# Patient Record
Sex: Male | Born: 1950 | Race: White | Hispanic: No | Marital: Single | Smoking: Current every day smoker
Health system: Southern US, Community
[De-identification: ages and names within clinical notes are randomized; demographics above are authoritative.]

## PROBLEM LIST (undated history)

## (undated) DIAGNOSIS — E119 Type 2 diabetes mellitus without complications: Secondary | ICD-10-CM

## (undated) DIAGNOSIS — I10 Essential (primary) hypertension: Secondary | ICD-10-CM

## (undated) DIAGNOSIS — J449 Chronic obstructive pulmonary disease, unspecified: Secondary | ICD-10-CM

## (undated) DIAGNOSIS — I219 Acute myocardial infarction, unspecified: Secondary | ICD-10-CM

## (undated) HISTORY — PX: CAROTID ENDARTERECTOMY: SUR193

## (undated) HISTORY — PX: HERNIA REPAIR: SHX51

## (undated) HISTORY — PX: CARDIAC SURGERY: SHX584

---

## 2015-07-06 ENCOUNTER — Emergency Department: Payer: Medicare Other

## 2015-07-06 ENCOUNTER — Encounter: Payer: Self-pay | Admitting: Emergency Medicine

## 2015-07-06 ENCOUNTER — Observation Stay
Admission: EM | Admit: 2015-07-06 | Discharge: 2015-07-08 | Disposition: A | Discharge status: Skilled Nursing Facility | Payer: Medicare Other | Attending: Internal Medicine | Admitting: Internal Medicine

## 2015-07-06 DIAGNOSIS — E1140 Type 2 diabetes mellitus with diabetic neuropathy, unspecified: Secondary | ICD-10-CM | POA: Insufficient documentation

## 2015-07-06 DIAGNOSIS — Z7902 Long term (current) use of antithrombotics/antiplatelets: Secondary | ICD-10-CM | POA: Insufficient documentation

## 2015-07-06 DIAGNOSIS — J9811 Atelectasis: Secondary | ICD-10-CM | POA: Diagnosis not present

## 2015-07-06 DIAGNOSIS — J449 Chronic obstructive pulmonary disease, unspecified: Secondary | ICD-10-CM | POA: Insufficient documentation

## 2015-07-06 DIAGNOSIS — Z79891 Long term (current) use of opiate analgesic: Secondary | ICD-10-CM | POA: Diagnosis not present

## 2015-07-06 DIAGNOSIS — Z7982 Long term (current) use of aspirin: Secondary | ICD-10-CM | POA: Diagnosis not present

## 2015-07-06 DIAGNOSIS — R0902 Hypoxemia: Secondary | ICD-10-CM | POA: Insufficient documentation

## 2015-07-06 DIAGNOSIS — Z87891 Personal history of nicotine dependence: Secondary | ICD-10-CM | POA: Diagnosis not present

## 2015-07-06 DIAGNOSIS — R918 Other nonspecific abnormal finding of lung field: Secondary | ICD-10-CM | POA: Insufficient documentation

## 2015-07-06 DIAGNOSIS — R531 Weakness: Secondary | ICD-10-CM | POA: Insufficient documentation

## 2015-07-06 DIAGNOSIS — Z7951 Long term (current) use of inhaled steroids: Secondary | ICD-10-CM | POA: Insufficient documentation

## 2015-07-06 DIAGNOSIS — Z794 Long term (current) use of insulin: Secondary | ICD-10-CM | POA: Insufficient documentation

## 2015-07-06 DIAGNOSIS — I252 Old myocardial infarction: Secondary | ICD-10-CM | POA: Diagnosis not present

## 2015-07-06 DIAGNOSIS — Z951 Presence of aortocoronary bypass graft: Secondary | ICD-10-CM | POA: Insufficient documentation

## 2015-07-06 DIAGNOSIS — I10 Essential (primary) hypertension: Secondary | ICD-10-CM | POA: Insufficient documentation

## 2015-07-06 DIAGNOSIS — R079 Chest pain, unspecified: Secondary | ICD-10-CM | POA: Diagnosis not present

## 2015-07-06 DIAGNOSIS — F319 Bipolar disorder, unspecified: Secondary | ICD-10-CM | POA: Insufficient documentation

## 2015-07-06 DIAGNOSIS — J90 Pleural effusion, not elsewhere classified: Secondary | ICD-10-CM | POA: Insufficient documentation

## 2015-07-06 DIAGNOSIS — R197 Diarrhea, unspecified: Secondary | ICD-10-CM | POA: Diagnosis not present

## 2015-07-06 DIAGNOSIS — Z79899 Other long term (current) drug therapy: Secondary | ICD-10-CM | POA: Insufficient documentation

## 2015-07-06 DIAGNOSIS — Z8249 Family history of ischemic heart disease and other diseases of the circulatory system: Secondary | ICD-10-CM | POA: Insufficient documentation

## 2015-07-06 DIAGNOSIS — R071 Chest pain on breathing: Secondary | ICD-10-CM

## 2015-07-06 HISTORY — DX: Acute myocardial infarction, unspecified: I21.9

## 2015-07-06 HISTORY — DX: Essential (primary) hypertension: I10

## 2015-07-06 HISTORY — DX: Type 2 diabetes mellitus without complications: E11.9

## 2015-07-06 HISTORY — DX: Chronic obstructive pulmonary disease, unspecified: J44.9

## 2015-07-06 LAB — COMPREHENSIVE METABOLIC PANEL
ALBUMIN: 3.2 g/dL — AB (ref 3.5–5.0)
ALK PHOS: 67 U/L (ref 38–126)
ALT: 13 U/L — ABNORMAL LOW (ref 17–63)
AST: 22 U/L (ref 15–41)
Anion gap: 6 (ref 5–15)
BUN: 12 mg/dL (ref 6–20)
CALCIUM: 8.8 mg/dL — AB (ref 8.9–10.3)
CO2: 28 mmol/L (ref 22–32)
Chloride: 103 mmol/L (ref 101–111)
Creatinine, Ser: 0.65 mg/dL (ref 0.61–1.24)
Glucose, Bld: 298 mg/dL — ABNORMAL HIGH (ref 65–99)
Potassium: 4.4 mmol/L (ref 3.5–5.1)
SODIUM: 137 mmol/L (ref 135–145)
Total Bilirubin: 0.3 mg/dL (ref 0.3–1.2)
Total Protein: 6.5 g/dL (ref 6.5–8.1)

## 2015-07-06 LAB — TROPONIN I
Troponin I: <0.03 ng/mL (ref <0.031)
Troponin I: <0.03 ng/mL (ref <0.031)

## 2015-07-06 LAB — GLUCOSE, CAPILLARY: GLUCOSE-CAPILLARY: 153 mg/dL — AB (ref 65–99)

## 2015-07-06 LAB — CBC
HCT: 34.1 % — ABNORMAL LOW (ref 40.0–52.0)
Hemoglobin: 11.2 g/dL — ABNORMAL LOW (ref 13.0–18.0)
MCH: 26.3 pg (ref 26.0–34.0)
MCHC: 32.8 g/dL (ref 32.0–36.0)
MCV: 80.1 fL (ref 80.0–100.0)
Platelets: 230 10*3/uL (ref 150–440)
RBC: 4.26 MIL/uL — AB (ref 4.40–5.90)
RDW: 16 % — AB (ref 11.5–14.5)
WBC: 8.4 10*3/uL (ref 3.8–10.6)

## 2015-07-06 LAB — BRAIN NATRIURETIC PEPTIDE: B Natriuretic Peptide: 122 pg/mL — ABNORMAL HIGH (ref 0.0–100.0)

## 2015-07-06 MED ORDER — BUDESONIDE-FORMOTEROL FUMARATE 160-4.5 MCG/ACT IN AERO
2.0000 | INHALATION_SPRAY | Freq: Every day | RESPIRATORY_TRACT | Status: DC
  Administered 2015-07-07 – 2015-07-08 (×2): 2 via RESPIRATORY_TRACT
  Filled 2015-07-06: qty 6

## 2015-07-06 MED ORDER — PANTOPRAZOLE SODIUM 40 MG PO TBEC
40.0000 mg | DELAYED_RELEASE_TABLET | Freq: Every day | ORAL | Status: DC
  Administered 2015-07-07 – 2015-07-08 (×2): 40 mg via ORAL
  Filled 2015-07-06 (×2): qty 1

## 2015-07-06 MED ORDER — NITROGLYCERIN 2 % TD OINT
0.5000 [in_us] | TOPICAL_OINTMENT | Freq: Once | TRANSDERMAL | Status: AC
  Administered 2015-07-06: 0.5 [in_us] via TOPICAL
  Filled 2015-07-06: qty 1

## 2015-07-06 MED ORDER — INSULIN ASPART 100 UNIT/ML ~~LOC~~ SOLN: 0.0000 [IU] | Freq: Every day | SUBCUTANEOUS | Status: DC

## 2015-07-06 MED ORDER — MIRTAZAPINE 15 MG PO TABS
15.0000 mg | ORAL_TABLET | Freq: Every day | ORAL | Status: DC
Start: 2015-07-06 — End: 2015-07-08
  Administered 2015-07-06 – 2015-07-07 (×2): 15 mg via ORAL
  Filled 2015-07-06 (×2): qty 1

## 2015-07-06 MED ORDER — CLOPIDOGREL BISULFATE 75 MG PO TABS
75.0000 mg | ORAL_TABLET | Freq: Every day | ORAL | Status: DC
  Administered 2015-07-07 – 2015-07-08 (×2): 75 mg via ORAL
  Filled 2015-07-06 (×2): qty 1

## 2015-07-06 MED ORDER — OLANZAPINE 10 MG PO TABS
10.0000 mg | ORAL_TABLET | Freq: Every day | ORAL | Status: DC
  Administered 2015-07-06 – 2015-07-07 (×2): 10 mg via ORAL
  Filled 2015-07-06 (×2): qty 1

## 2015-07-06 MED ORDER — INSULIN GLARGINE 100 UNIT/ML ~~LOC~~ SOLN
20.0000 [IU] | Freq: Every day | SUBCUTANEOUS | Status: DC
  Administered 2015-07-06 – 2015-07-07 (×2): 20 [IU] via SUBCUTANEOUS
  Filled 2015-07-06 (×4): qty 0.2

## 2015-07-06 MED ORDER — ATORVASTATIN CALCIUM 20 MG PO TABS
80.0000 mg | ORAL_TABLET | Freq: Every day | ORAL | Status: DC
  Administered 2015-07-06 – 2015-07-07 (×2): 80 mg via ORAL
  Filled 2015-07-06 (×2): qty 4

## 2015-07-06 MED ORDER — ACETAMINOPHEN 325 MG PO TABS: 650.0000 mg | ORAL_TABLET | Freq: Four times a day (QID) | ORAL | Status: DC | PRN

## 2015-07-06 MED ORDER — SODIUM CHLORIDE 0.9 % IJ SOLN
3.0000 mL | Freq: Two times a day (BID) | INTRAMUSCULAR | Status: DC
  Administered 2015-07-07 – 2015-07-08 (×3): 3 mL via INTRAVENOUS

## 2015-07-06 MED ORDER — TAMSULOSIN HCL 0.4 MG PO CAPS
0.4000 mg | ORAL_CAPSULE | Freq: Every day | ORAL | Status: DC
  Administered 2015-07-07 – 2015-07-08 (×2): 0.4 mg via ORAL
  Filled 2015-07-06 (×2): qty 1

## 2015-07-06 MED ORDER — METOPROLOL TARTRATE 25 MG PO TABS
25.0000 mg | ORAL_TABLET | Freq: Two times a day (BID) | ORAL | Status: DC
  Administered 2015-07-06 – 2015-07-07 (×3): 25 mg via ORAL
  Filled 2015-07-06 (×3): qty 1

## 2015-07-06 MED ORDER — PNEUMOCOCCAL VAC POLYVALENT 25 MCG/0.5ML IJ INJ: 0.5000 mL | INJECTION | INTRAMUSCULAR | Status: DC

## 2015-07-06 MED ORDER — SODIUM CHLORIDE 0.9 % IJ SOLN: 3.0000 mL | INTRAMUSCULAR | Status: DC | PRN

## 2015-07-06 MED ORDER — ENOXAPARIN SODIUM 40 MG/0.4ML ~~LOC~~ SOLN
40.0000 mg | SUBCUTANEOUS | Status: DC
  Administered 2015-07-06 – 2015-07-07 (×2): 40 mg via SUBCUTANEOUS
  Filled 2015-07-06 (×2): qty 0.4

## 2015-07-06 MED ORDER — VALPROIC ACID 250 MG PO CAPS
250.0000 mg | ORAL_CAPSULE | Freq: Two times a day (BID) | ORAL | Status: DC
Start: 2015-07-06 — End: 2015-07-08
  Administered 2015-07-06 – 2015-07-08 (×4): 250 mg via ORAL
  Filled 2015-07-06 (×5): qty 1

## 2015-07-06 MED ORDER — ISOSORBIDE MONONITRATE ER 30 MG PO TB24
30.0000 mg | ORAL_TABLET | Freq: Every day | ORAL | Status: DC
  Administered 2015-07-07: 30 mg via ORAL
  Filled 2015-07-06: qty 1

## 2015-07-06 MED ORDER — SODIUM CHLORIDE 0.9 % IV SOLN: 250.0000 mL | INTRAVENOUS | Status: DC | PRN

## 2015-07-06 MED ORDER — INSULIN ASPART 100 UNIT/ML ~~LOC~~ SOLN
0.0000 [IU] | Freq: Three times a day (TID) | SUBCUTANEOUS | Status: DC
  Administered 2015-07-07: 2 [IU] via SUBCUTANEOUS
  Administered 2015-07-07: 3 [IU] via SUBCUTANEOUS
  Administered 2015-07-08: 1 [IU] via SUBCUTANEOUS
  Filled 2015-07-06: qty 3
  Filled 2015-07-06: qty 2
  Filled 2015-07-06: qty 1

## 2015-07-06 MED ORDER — SODIUM CHLORIDE 0.9 % IJ SOLN
3.0000 mL | Freq: Two times a day (BID) | INTRAMUSCULAR | Status: DC
  Administered 2015-07-06 – 2015-07-08 (×4): 3 mL via INTRAVENOUS

## 2015-07-06 MED ORDER — ASPIRIN EC 81 MG PO TBEC
81.0000 mg | DELAYED_RELEASE_TABLET | Freq: Every day | ORAL | Status: DC
  Administered 2015-07-07 – 2015-07-08 (×2): 81 mg via ORAL
  Filled 2015-07-06 (×2): qty 1

## 2015-07-06 MED ORDER — ACETAMINOPHEN 650 MG RE SUPP: 650.0000 mg | Freq: Four times a day (QID) | RECTAL | Status: DC | PRN

## 2015-07-06 MED ORDER — LINAGLIPTIN 5 MG PO TABS
5.0000 mg | ORAL_TABLET | Freq: Every day | ORAL | Status: DC
  Administered 2015-07-07 – 2015-07-08 (×2): 5 mg via ORAL
  Filled 2015-07-06 (×2): qty 1

## 2015-07-06 NOTE — ED Notes (Signed)
Spoke with MD Letitia Libra. Medications not due at this time.  Pt took his medications this morning. These are were to be sign and held.

## 2015-07-06 NOTE — ED Notes (Signed)
Pt here with daughter. Pt resides in [place]. Reports that about 3 weeks ago, he had cardiac surgery.  Pt here today due to intermittent CP and weakness.  This has been ongoing since the surgery.  Pt also reports episodes of diarrhea but does state that this has been going for a months.  Pt denies nausea.  Pt quit smoking about 2 months ago and has been smoking since it was 64 yrs old.  Pt is alert and oriented, acting appropriately.  Daughter is requesting placement in SNF for cardia rehab.

## 2015-07-06 NOTE — ED Provider Notes (Signed)
Time Seen: Approximately 1330  I have reviewed the triage notes  Chief Complaint: Chest Pain   History of Present Illness: Mario Peterson is a 64 y.o. male who is here with his daughter and recently had surgery performed at [facility] in [place]. He had a quadruple bypass surgery performed which sounds like it was uneventful. Patient was advised according to his daughter to have rehabilitation done at that time but refused. He comes to the emergency department today with multiple complaints including some diffuse weakness, intermittent chest pain with some mild shortness of breath. He said multiple episodes of loose watery stool without any melena or hematochezia. Apparently the loose stools been going on for "" months but got worse after his surgery. The patient denies any persistent nausea, vomiting, fever. His daughter goes on to state that he has been noncompliant with his medications including his psych meds which she is currently trying to establish all of his medications. He states he is also continued to smoke and apparently is not taking his other prescription medication at times. Patient denies being suicidal, homicidal, or having any hallucinations.   Past Medical History  Diagnosis Date  . Diabetes mellitus without complication   . Hypertension   . COPD (chronic obstructive pulmonary disease)   . MI (myocardial infarction)     There are no active problems to display for this patient.   Past Surgical History  Procedure Laterality Date  . Cardiac surgery      Past Surgical History  Procedure Laterality Date  . Cardiac surgery      No current outpatient prescriptions on file.  Allergies:  Review of patient's allergies indicates no known allergies.  Family History: No family history on file.  Social History: Social History  Substance Use Topics  . Smoking status: Former Games developer  . Smokeless tobacco: None  . Alcohol Use: No     Review of Systems:    10 point review of systems was performed and was otherwise negative:  Constitutional: No fever Eyes: No visual disturbances ENT: No sore throat, ear pain Cardiac: No chest pain Respiratory: Describes some mild shortness of breath. Abdomen: No abdominal pain, no vomiting, No diarrhea Endocrine: No weight loss, No night sweats Extremities: No peripheral edema, cyanosis Skin: No rashes, easy bruising Neurologic: He states chronic neuropathy with some tenderness in both lower extremities which is chronic for him No focal weakness, trouble with speech or swollowing Urologic: No dysuria, Hematuria, or urinary frequency Patient answers questions appropriately and does not appear to have any acute psychiatric emergency  Physical Exam:  ED Triage Vitals  Enc Vitals Group     BP 07/06/15 1227 143/57 mmHg     Pulse Rate 07/06/15 1227 85     Resp 07/06/15 1227 20     Temp 07/06/15 1227 98.2 F (36.8 C)     Temp Source 07/06/15 1227 Oral     SpO2 07/06/15 1227 91 %     Weight 07/06/15 1227 172 lb (78.019 kg)     Height 07/06/15 1227 5\' 7"  (1.702 m)     Head Cir --      Peak Flow --      Pain Score 07/06/15 1229 8     Pain Loc --      Pain Edu? --      Excl. in GC? --     General: Awake , Alert , and Oriented times 3; GCS 15 Head: Normal cephalic , atraumatic Eyes: Pupils  equal , round, reactive to light Nose/Throat: No nasal drainage, patent upper airway without erythema or exudate.  Neck: Supple, Full range of motion, No anterior adenopathy or palpable thyroid masses Lungs: Mild rhonchi heard at the left base posteriorly. No wheezes or rales are noted. Heart: Regular rate, regular rhythm without murmurs , gallops , or rubs Abdomen: Soft, non tender without rebound, guarding , or rigidity; bowel sounds positive and symmetric in all 4 quadrants. No organomegaly .        Extremities: Mild tenderness circumferentially in both lower extremities 2 plus symmetric pulses. No edema,  clubbing or cyanosis Neurologic: normal ambulation, Motor symmetric without deficits, sensory intact Skin: warm, dry, no rashes   Labs:   All laboratory work was reviewed including any pertinent negatives or positives listed below:  Labs Reviewed  CBC - Abnormal; Notable for the following:    RBC 4.26 (*)    Hemoglobin 11.2 (*)    HCT 34.1 (*)    RDW 16.0 (*)    All other components within normal limits  COMPREHENSIVE METABOLIC PANEL - Abnormal; Notable for the following:    Glucose, Bld 298 (*)    Calcium 8.8 (*)    Albumin 3.2 (*)    ALT 13 (*)    All other components within normal limits  BRAIN NATRIURETIC PEPTIDE - Abnormal; Notable for the following:    B Natriuretic Peptide 122.0 (*)    All other components within normal limits  C DIFFICILE QUICK SCREEN W PCR REFLEX  TROPONIN I  GI PATHOGEN PANEL BY PCR, STOOL    EKG: ED ECG REPORT I, Jennye Moccasin, the attending physician, personally viewed and interpreted this ECG.  Date: 07/06/2015 EKG Time: 1223 Rate: 86 Rhythm: normal sinus rhythm QRS Axis: normal Intervals: normal ST/T Wave abnormalities: normal Conduction Disutrbances: none Narrative Interpretation: unremarkable Mild old inferior changes.   Radiology: I personally reviewed the radiologic studies   Procedures: Patient had IV established by the nursing staff was placed on continuous cardiac monitor and remained in normal sinus rhythm and no ectopy   Critical Care: None CLINICAL DATA: Chest pain, recent bypass surgery  EXAM: PORTABLE CHEST - 1 VIEW  COMPARISON: None.  FINDINGS: Borderline cardiomegaly. Status post median sternotomy. No pulmonary edema. There is small left pleural effusion with left basilar atelectasis on infiltrate. Trace atelectasis right base.  IMPRESSION: Status post median sternotomy. No pulmonary edema. Small left pleural effusion with left basilar atelectasis or infiltrate. Trace right basilar  atelectasis.   ED Course:  Patient received a low dose nitroglycerin placed. His vital signs remained stable and remained in normal sinus rhythm on the monitor. Patient denies any current chest pain or shortness of breath and his laboratory work reviewed shows no significant abnormalities. Daughters had a hard time taking care of the patient at home due to his noncompliance and a psychiatric history. She is requesting some form of rehabilitation and possible placement. Patient has C. difficile testing is been ordered but given the amount of diarrhea and is otherwise normal clinical assessment I don't strongly suspect this is C. difficile colitis. The patient's chest pain and shortness of breath seems to be intermittent and doesn't seem to be consistent with obvious acute coronary syndrome with negative troponin at this point. I reviewed the case with the hospitalist mainly due to his social situation etc., he is agreed to see and evaluate the patient further disposition and management. Upon her evaluation.    Assessment:  Chest pain with  history of coronary artery disease History of noncompliance Psychiatric history  Final Clinical Impression:   Final diagnoses:  Chest pain on respiration     Plan: Hospitalist consultation            Jennye Moccasin, MD 07/06/15 [phone]

## 2015-07-06 NOTE — H&P (Addendum)
Mario Peterson is an 64 y.o. male.   Chief Complaint: Chest pain and weakness HPI: Patient is s/p CABG 3 weeks ago at [facility] in [place]. Staying with daughter here in town. C/O chest pain that started today. Hurts worse when he moves. No shortness of breath. Says he has been getting weaker with more trouble ambulating to the point of now falling frequently.   Past Medical History  Diagnosis Date  . Diabetes mellitus without complication   . Hypertension   . COPD (chronic obstructive pulmonary disease)   . MI (myocardial infarction)     * Bipolar Disorder   * Peripheral Neuropathy  Past Surgical History  Procedure Laterality Date  . Cardiac surgery     * CABG  No family history on file.  Positive for CAD.  Social History:  reports that he has quit smoking. He does not have any smokeless tobacco history on file. He reports that he does not drink alcohol or use illicit drugs.  Allergies: No Known Allergies   (Not in a hospital admission)  Results for orders placed or performed during the hospital encounter of 07/06/15 (from the past 48 hour(s))  CBC     Status: Abnormal   Collection Time: 07/06/15 12:45 PM  Result Value Ref Range   WBC 8.4 3.8 - 10.6 K/uL   RBC 4.26 (L) 4.40 - 5.90 MIL/uL   Hemoglobin 11.2 (L) 13.0 - 18.0 g/dL   HCT 34.1 (L) 40.0 - 52.0 %   MCV 80.1 80.0 - 100.0 fL   MCH 26.3 26.0 - 34.0 pg   MCHC 32.8 32.0 - 36.0 g/dL   RDW 16.0 (H) 11.5 - 14.5 %   Platelets 230 150 - 440 K/uL  Comprehensive metabolic panel     Status: Abnormal   Collection Time: 07/06/15 12:45 PM  Result Value Ref Range   Sodium 137 135 - 145 mmol/L   Potassium 4.4 3.5 - 5.1 mmol/L   Chloride 103 101 - 111 mmol/L   CO2 28 22 - 32 mmol/L   Glucose, Bld 298 (H) 65 - 99 mg/dL   BUN 12 6 - 20 mg/dL   Creatinine, Ser 0.65 0.61 - 1.24 mg/dL   Calcium 8.8 (L) 8.9 - 10.3 mg/dL   Total Protein 6.5 6.5 - 8.1 g/dL   Albumin 3.2 (L) 3.5 - 5.0 g/dL   AST 22 15 - 41 U/L   ALT 13 (L)  17 - 63 U/L   Alkaline Phosphatase 67 38 - 126 U/L   Total Bilirubin 0.3 0.3 - 1.2 mg/dL   GFR calc non Af Amer >60 >60 mL/min   GFR calc Af Amer >60 >60 mL/min    Comment: (NOTE) The eGFR has been calculated using the CKD EPI equation. This calculation has not been validated in all clinical situations. eGFR's persistently <60 mL/min signify possible Chronic Kidney Disease.    Anion gap 6 5 - 15  Troponin I     Status: None   Collection Time: 07/06/15 12:45 PM  Result Value Ref Range   Troponin I <0.03 <0.031 ng/mL    Comment:        NO INDICATION OF MYOCARDIAL INJURY.   Brain natriuretic peptide     Status: Abnormal   Collection Time: 07/06/15 12:45 PM  Result Value Ref Range   B Natriuretic Peptide 122.0 (H) 0.0 - 100.0 pg/mL   Dg Chest Port 1 View  07/06/2015   CLINICAL DATA:  Chest pain, recent bypass surgery  EXAM: PORTABLE CHEST - 1 VIEW  COMPARISON:  None.  FINDINGS: Borderline cardiomegaly. Status post median sternotomy. No pulmonary edema. There is small left pleural effusion with left basilar atelectasis on infiltrate. Trace atelectasis right base.  IMPRESSION: Status post median sternotomy. No pulmonary edema. Small left pleural effusion with left basilar atelectasis or infiltrate. Trace right basilar atelectasis.   Electronically Signed   By: Lahoma Crocker M.D.   On: 07/06/2015 13:56    Review of Systems  Constitutional: Negative for fever.  HENT: Negative for hearing loss.   Eyes: Negative for blurred vision.  Respiratory: Negative for shortness of breath.   Cardiovascular: Positive for chest pain and leg swelling.  Gastrointestinal: Positive for diarrhea.  Genitourinary: Negative for dysuria.  Musculoskeletal: Positive for joint pain.  Skin: Negative for rash.  Neurological: Positive for weakness. Negative for focal weakness.  Psychiatric/Behavioral:       Bipolar    Blood pressure 157/74, pulse 85, temperature 98.2 F (36.8 C), temperature source Oral, resp.  rate 15, height 5' 7"  (1.702 m), weight 78.019 kg (172 lb), SpO2 93 %. Physical Exam   Assessment/Plan 1. Chest Pain: May be chest wall pain secondary from incision site. However, with hx of CAD will observe and rule out.  2. Weakness: Likely deconditioning from surgery. Will have PT eval to see if he needs rehab or home PT.  3. Bipolar Disorder: Continue current meds.  4. HTN: Continue home meds.  5: DM: Monitor and adjust meds as needed.  6. Diarrhea: Appears chronic. C. Dif ordered.  Time spent 30 min.  Baxter Hire 07/06/2015, 4:37 PM

## 2015-07-06 NOTE — ED Notes (Signed)
Pt to ED with c/o midsternal chest tightness since yesterday, had bypass surgery 3 weeks ago, states he also has had diarrhea since being discharge from the hospital, has had diarrhea x 1 today, denies any sob

## 2015-07-06 NOTE — ED Notes (Signed)
Admit MD in to see patient.

## 2015-07-07 LAB — GLUCOSE, CAPILLARY
GLUCOSE-CAPILLARY: 169 mg/dL — AB (ref 65–99)
Glucose-Capillary: 116 mg/dL — ABNORMAL HIGH (ref 65–99)
Glucose-Capillary: 218 mg/dL — ABNORMAL HIGH (ref 65–99)
Glucose-Capillary: 152 mg/dL — ABNORMAL HIGH (ref 65–99)

## 2015-07-07 LAB — C DIFFICILE QUICK SCREEN W PCR REFLEX
C DIFFICILE (CDIFF) INTERP: NEGATIVE
C Diff antigen: NEGATIVE
C Diff toxin: NEGATIVE

## 2015-07-07 LAB — TROPONIN I

## 2015-07-07 NOTE — Evaluation (Signed)
Physical Therapy Evaluation Patient Details Name: Mario Peterson MRN: [id number] DOB: November 13, 1951 Today's Date: 07/07/2015   History of Present Illness  Pt is a 64 y.o. male presenting to hospital with chest pain and weakness.  Pt s/p CABG in [place] about 3 weeks ago and now with more difficulty ambulating and now falling frequently per notes.  Clinical Impression  Currently pt demonstrates impairments with strength, balance, and limitations with functional mobility.  Prior to admission, pt was not using an AD and c/o frequent falls and difficulty ambulating (pt s/p CABG about 3 weeks ago in [place]).  Pt currently staying with his daughter locally (daughter works).  Currently pt is min assist supine to sit and CGA to min assist with ambulating 30 feet with RW (limited distance d/t fatigue; gait impairments noted).  No c/o chest pain during session but pt did c/o general LE pain B.  Pt would benefit from skilled PT to address above noted impairments and functional limitations.  Recommend pt discharge to STR when medically appropriate.     Follow Up Recommendations SNF    Equipment Recommendations  Rolling walker with 5" wheels    Recommendations for Other Services       Precautions / Restrictions Precautions Precautions: Fall Precaution Comments: s/p CABG about 3 weeks ago Restrictions Weight Bearing Restrictions: No      Mobility  Bed Mobility Overal bed mobility: Needs Assistance Bed Mobility: Supine to Sit;Sit to Supine     Supine to sit: HOB elevated;Min assist Sit to supine: Supervision;HOB elevated   General bed mobility comments: increased time to perform required  Transfers Overall transfer level: Needs assistance Equipment used: Rolling walker (2 wheeled);None Transfers: Sit to/from [employer] to Stand: Min guard Stand pivot transfers: Min guard (stand step turn bed to commode)          Ambulation/Gait Ambulation/Gait assistance: Min guard;Min  assist Ambulation Distance (Feet): 30 Feet Assistive device: Rolling walker (2 wheeled)   Gait velocity: decreased cadence   General Gait Details: decreased B step length/foot clearance/heelstrike; vc's required to stay closer to RW and for upright posture; LE's appearing "stiff" with movement; limited distance d/t c/o fatigue  Stairs            Wheelchair Mobility    Modified Rankin (Stroke Patients Only)       Balance Overall balance assessment: Needs assistance Sitting-balance support: Feet supported;No upper extremity supported Sitting balance-Leahy Scale: Normal     Standing balance support: Bilateral upper extremity supported Standing balance-Leahy Scale: Good                               Pertinent Vitals/Pain Pain Assessment: 0-10 Pain Score: 4  Pain Location: LE pain (general) Pain Descriptors / Indicators: Aching Pain Intervention(s): Limited activity within patient's tolerance;Monitored during session;Repositioned  See flow sheet for details [facility] on 2 L/min via nasal cannula).    Home Living Family/patient expects to be discharged to:: Skilled nursing facility Living Arrangements: Children (staying with daughter locally) Available Help at Discharge: Family (pt's daughter works) Type of Home: House Home Access: Stairs to enter   [employer] of Steps: 4 with L rail plus 1 with no rail Home Layout: One level Home Equipment: None      Prior Function Level of Independence: Needs assistance         Comments: Pt reports falling at home recently (mostly when rushing to bathroom d/t diarrhea)  Hand Dominance        Extremity/Trunk Assessment   Upper Extremity Assessment: Generalized weakness           Lower Extremity Assessment: Generalized weakness (unable to formally MMT d/t pt c/o pain with touch of LE's)         Communication   Communication: No difficulties  Cognition Arousal/Alertness:  Awake/alert Behavior During Therapy: WFL for tasks assessed/performed Overall Cognitive Status: Within Functional Limits for tasks assessed                      General Comments General comments (skin integrity, edema, etc.): sternal incision intact  Nursing cleared pt for participation in physical therapy.  Pt agreeable to PT session.    Exercises   Performed semi-supine B LE therapeutic exercise x 10 reps:  Ankle pumps (AROM B LE's); quad sets x3 second holds (AROM B LE's); glute squeezes x3 second holds (AROM B); SAQ's (AROM R; AROM L); heelslides (AROM R; AROM L).  Pt required vc's and tactile cues for correct technique with exercises and extra time to perform (pt feeling stiff and LE's painful in general).       Assessment/Plan    PT Assessment Patient needs continued PT services  PT Diagnosis Difficulty walking;Generalized weakness   PT Problem List Decreased strength;Decreased activity tolerance;Decreased balance;Decreased mobility;Decreased knowledge of precautions;Pain  PT Treatment Interventions DME instruction;Gait training;Stair training;Functional mobility training;Therapeutic activities;Therapeutic exercise;Balance training;Patient/family education   PT Goals (Current goals can be found in the Care Plan section) Acute Rehab PT Goals Patient Stated Goal: to get stronger PT Goal Formulation: With patient Time For Goal Achievement: 07/21/15 Potential to Achieve Goals: Good    Frequency Min 2X/week   Barriers to discharge Decreased caregiver support (Pt's daughter works)      Control and instrumentation engineer of Session Equipment Utilized During Treatment: Gait belt;Oxygen (2 L/min via nasal cannula) Activity Tolerance: Patient limited by fatigue Patient left: in bed;with call bell/phone within reach;with bed alarm set      Functional Assessment Tool Used: AM-PAC without stairs Functional Limitation: Mobility: Walking and moving around Mobility:  Walking and Moving Around Current Status (Q4696): At least 40 percent but less than 60 percent impaired, limited or restricted Mobility: Walking and Moving Around Goal Status [phone]): 0 percent impaired, limited or restricted    Time: 0915-0950 PT Time Calculation (min) (ACUTE ONLY): 35 min   Charges:   PT Evaluation $Initial PT Evaluation Tier I: 1 Procedure PT Treatments $Therapeutic Exercise: 8-22 mins   PT G Codes:   PT G-Codes **NOT FOR INPATIENT CLASS** Functional Assessment Tool Used: AM-PAC without stairs Functional Limitation: Mobility: Walking and moving around Mobility: Walking and Moving Around Current Status (U1324): At least 40 percent but less than 60 percent impaired, limited or restricted Mobility: Walking and Moving Around Goal Status [phone]): 0 percent impaired, limited or restricted    Hendricks Limes 07/07/2015, 10:16 AM Hendricks Limes, PT [phone]

## 2015-07-07 NOTE — Progress Notes (Signed)
[facility] [place] at [facility]   PATIENT NAME: Ezekiel Menzer    MR#:  [id number]  DATE OF BIRTH:  05-26-1951  SUBJECTIVE:  CHIEF COMPLAINT:   Chief Complaint  Patient presents with  . Chest Pain  now resolved, had some transient hypoxia with saturations dropping but with deep breath it came up.  Feels very weak REVIEW OF SYSTEMS:  Review of Systems  Constitutional: Positive for malaise/fatigue. Negative for fever, weight loss and diaphoresis.  HENT: Negative for ear discharge, ear pain, hearing loss, nosebleeds, sore throat and tinnitus.   Eyes: Negative for blurred vision and pain.  Respiratory: Negative for cough, hemoptysis, shortness of breath and wheezing.   Cardiovascular: Positive for chest pain. Negative for palpitations, orthopnea and leg swelling.  Gastrointestinal: Negative for heartburn, nausea, vomiting, abdominal pain, diarrhea, constipation and blood in stool.  Genitourinary: Negative for dysuria, urgency and frequency.  Musculoskeletal: Negative for myalgias and back pain.  Skin: Negative for itching and rash.  Neurological: Positive for weakness. Negative for dizziness, tingling, tremors, focal weakness, seizures and headaches.  Psychiatric/Behavioral: Negative for depression. The patient is not nervous/anxious.    DRUG ALLERGIES:  No Known Allergies VITALS:  Blood pressure 162/80, pulse 87, temperature 98 F (36.7 C), temperature source Oral, resp. rate 19, height  (1.702 m), weight 78.019 kg (172 lb), SpO2 96 %. PHYSICAL EXAMINATION:  Physical Exam  Constitutional: He is oriented to person, place, and time and well-developed, well-nourished, and in no distress.  HENT:  Head: Normocephalic and atraumatic.  Eyes: Conjunctivae and EOM are normal. Pupils are equal, round, and reactive to light.  Neck: Normal range of motion. Neck supple. No tracheal deviation present. No thyromegaly present.  Cardiovascular: Normal rate,  regular rhythm and normal heart sounds.   Pulmonary/Chest: Effort normal and breath sounds normal. No respiratory distress. He has no wheezes. He exhibits no tenderness.  Abdominal: Soft. Bowel sounds are normal. He exhibits no distension. There is no tenderness.  Musculoskeletal: Normal range of motion.  Neurological: He is alert and oriented to person, place, and time. No cranial nerve deficit.  Skin: Skin is warm and dry. No rash noted.  Psychiatric: Mood and affect normal.   LABORATORY PANEL:   CBC  Recent Labs Lab 07/06/15 1245  WBC 8.4  HGB 11.2*  HCT 34.1*  PLT 230   ------------------------------------------------------------------------------------------------------------------ Chemistries   Recent Labs Lab 07/06/15 1245  NA 137  K 4.4  CL 103  CO2 28  GLUCOSE 298*  BUN 12  CREATININE 0.65  CALCIUM 8.8*  AST 22  ALT 13*  ALKPHOS 67  BILITOT 0.3   ASSESSMENT AND PLAN:   1. Chest Pain: chest wall pain likely secondary from incision site. ruled out with serial troponins  2. Weakness: Likely deconditioning from surgery.  PT recommends rehabilitation  3. Bipolar Disorder: Continue current meds.  4. HTN: Continue home meds.  5: DM: Monitor and adjust meds as needed.  6. Diarrhea: Appears chronic. C. Dif pending   All the records are reviewed and case discussed with Care Management/Social Worker. Management plans discussed with the patient and he is in agreement.  CODE STATUS: Full code  TOTAL TIME TAKING CARE OF THIS PATIENT: 35 minutes.   More than 50% of the time was spent in counseling/coordination of care: YES  POSSIBLE D/C IN a.m., DEPENDING ON CLINICAL CONDITION.  Needs placement   [facility], Totiana Everson M.D on 07/07/2015 at 3:14 PM  Between 7am to 6pm - Pager -  [phone]  After 6pm go to [email or web address] - password [email or web address] [facility]  [facility]  Office  [phone]  CC: Primary care physician; No primary care provider on file.

## 2015-07-07 NOTE — Clinical Social Work Note (Signed)
Clinical Social Work Assessment  Patient Details  Name: Mario Peterson MRN: [id number] Date of Birth: [birth year]  Date of referral:  07/07/15               Reason for consult:  Facility Placement, Discharge Planning                Permission sought to share information with:  Family Supports, Customer service manager, Other Permission granted to share information::  Yes, Verbal Permission Granted  Name::     Mario Peterson  Agency::  [facility] [phone])  Relationship::  daughter  Contact Information:  [phone]  Housing/Transportation Living arrangements for the past 2 months:  Apartment Source of Information:  Patient, Adult Children Patient Interpreter Needed:  None Criminal Activity/Legal Involvement Pertinent to Current Situation/Hospitalization:  No - Comment as needed Significant Relationships:  Adult Children Lives with:  Self Do you feel safe going back to the place where you live?  Yes Need for family participation in patient care:  Yes (Comment)  Care giving concerns:  Pt's daughter expressed that she is concerned that pt has not returned to his baseline following bypass surgery in July.  Daughter explained that pt has been weak, impulsively making decisions, experiencing bowel incontinence and not consistently taking his medication.     Social Worker assessment / plan:  CSW met with pt and daughter to address consult for STR.  Pt is a resident of [place] and recently moved to live with his daughter in [place].  Pt's wife died in 2014-09-24.  Pt had a heart attack in December 2015.  Pt began receiving Home Health services in December 2015.  A "home attendant" provided care to pt 6 hours/day, 6 days per week.  Pt had bypass surgery in July 2016.  CSW spoke to [facility] [phone]) who confirmed that pt was admitted from 05/27/15-06/10/15.  Pt's daughter explained that recommendations were made in [place] for pt to go to [place].  Pt declined.  Pt is  now interested in going to [place] in [place] and prefers [facility]) near his best friend.    CSW explained to pt and daughter that pt would need a 3 night inpatient stay in order to qualify for Medicare coverage of STR.  CSW is continuing to research to see if pt would be able to qualify based upon his previous admission.    Employment status:  Disabled (Comment on whether or not currently receiving Disability) (Pt currently receives SSDI and SSI.) Insurance information:  Medicare, Catering manager PT Recommendations:  [place] / Referral to community resources:  [place]  Patient/Family's Response to care:  Pt and daughter were pleasant and appreciative of CSW assistance.  Patient/Family's Understanding of and Emotional Response to Diagnosis, Current Treatment, and Prognosis:  Pt acknowledged that he needs assistance and is hopeful that he will be able to go to STR.  Pt and daughter also expressed interest in completing advanced directives.  CSW spoke to chaplain and requested a consult.  Emotional Assessment Appearance:  Appears stated age Attitude/Demeanor/Rapport:  Other (Cooperative) Affect (typically observed):  Accepting, Pleasant, Appropriate Orientation:  Oriented to Self, Oriented to Place, Oriented to  Time, Oriented to Situation Alcohol / Substance use:  Not Applicable Psych involvement (Current and /or in the community):  No (Comment)  Discharge Needs  Concerns to be addressed:  Discharge Planning Concerns Readmission within the last 30 days:  Yes (Pt was hospitalized at [facility] in [place] from 05/27/15-06/10/15) Current discharge risk:  None Barriers to Discharge:  Continued Medical Work up   [place], [place], [place] 07/07/2015, 11:45 AM

## 2015-07-07 NOTE — Progress Notes (Signed)
   07/07/15 1100  Clinical Encounter Type  Visited With Patient and family together  Visit Type Initial;Spiritual support  Referral From Nurse  Consult/Referral To Chaplain  Spiritual Encounters  Spiritual Needs Prayer  Chaplain Anthonette Legato received order in the system to visit patient. Chaplain visited with patient and family. Offered prayer and pastoral care for patient and family. Chaplain [place], Ext. [phone] Chaplain Brittnee Gaetano A. Blong [place], Ext. [phone]

## 2015-07-08 ENCOUNTER — Encounter: Admission: RE | Admit: 2015-07-08 | Payer: Medicare Other | Source: Ambulatory Visit | Admitting: Internal Medicine

## 2015-07-08 LAB — GLUCOSE, CAPILLARY
GLUCOSE-CAPILLARY: 133 mg/dL — AB (ref 65–99)
Glucose-Capillary: 116 mg/dL — ABNORMAL HIGH (ref 65–99)

## 2015-07-08 NOTE — Discharge Summary (Signed)
[facility] [place] [facility]   PATIENT NAME: Mario Peterson    MR#:  [id number]  DATE OF BIRTH:  1951-09-06  DATE OF ADMISSION:  07/06/2015 ADMITTING PHYSICIAN: Gracelyn Nurse, MD  DATE OF DISCHARGE: 07/08/2015  PRIMARY CARE PHYSICIAN: No primary care provider on file.    ADMISSION DIAGNOSIS:  Chest pain on respiration [R07.1]  DISCHARGE DIAGNOSIS:  Active Problems:   Chest pain  SECONDARY DIAGNOSIS:   Past Medical History  Diagnosis Date  . Diabetes mellitus without complication   . Hypertension   . COPD (chronic obstructive pulmonary disease)   . MI (myocardial infarction)     HOSPITAL COURSE:  Patient is s/p CABG 3 weeks ago at [facility] in [place] admitted for chest pain.  Please see Dr. Stasia Cavalier dictated history and physical for further details.  Patient was ruled out with 3 negative serial troponins.  His chest pain was thought to be noncardiac.  He was evaluated by physical therapy and was recommended rehabilitation where he is being discharged in stable condition.  He is agreeable with the discharge plan and being discharged to rehabilitation in stable condition. DISCHARGE CONDITIONS:  Stable CONSULTS OBTAINED:    DRUG ALLERGIES:  No Known Allergies DISCHARGE MEDICATIONS:   Current Discharge Medication List    CONTINUE these medications which have NOT CHANGED   Details  aspirin EC 81 MG tablet Take 81 mg by mouth daily.    atorvastatin (LIPITOR) 80 MG tablet Take 80 mg by mouth at bedtime.    bisoprolol (ZEBETA) 5 MG tablet Take 5 mg by mouth daily.    clopidogrel (PLAVIX) 75 MG tablet Take 75 mg by mouth daily.    isosorbide mononitrate (IMDUR) 30 MG 24 hr tablet Take 30 mg by mouth daily.    JANUVIA 50 MG tablet Take 50 mg by mouth daily.    LANTUS SOLOSTAR 100 UNIT/ML Solostar Pen Inject 20 Units into the skin at bedtime.    metFORMIN (GLUCOPHAGE) 500 MG tablet Take 500 mg by mouth 2 (two) times daily.     metoprolol tartrate (LOPRESSOR) 25 MG tablet Take 25 mg by mouth 2 (two) times daily.    mirtazapine (REMERON) 15 MG tablet Take 15 mg by mouth at bedtime.    OLANZapine (ZYPREXA) 10 MG tablet Take 10 mg by mouth at bedtime.    oxycodone (ROXICODONE) 30 MG immediate release tablet Take 30 mg by mouth See admin instructions. Take 1 tablet by mouth up to 8 times a day as needed.    pantoprazole (PROTONIX) 40 MG tablet Take 40 mg by mouth daily.    SYMBICORT 160-4.5 MCG/ACT inhaler Inhale 2 puffs into the lungs daily.    tamsulosin (FLOMAX) 0.4 MG CAPS capsule Take 0.4 mg by mouth daily.    valproic acid (DEPAKENE) 250 MG capsule Take 250 mg by mouth every 12 (twelve) hours.       DISCHARGE INSTRUCTIONS:  Please hold patient's blood pressure medicine if systolic pressure less than 100. DIET:  Cardiac diet DISCHARGE CONDITION:  Good ACTIVITY:  Activity as tolerated OXYGEN:  Home Oxygen: No.  Oxygen Delivery: room air DISCHARGE LOCATION:  nursing home   If you experience worsening of your admission symptoms, develop shortness of breath, life threatening emergency, suicidal or homicidal thoughts you must seek medical attention immediately by calling 911 or calling your MD immediately  if symptoms less severe.  You Must read complete instructions/literature along with all the possible adverse reactions/side effects for  all the Medicines you take and that have been prescribed to you. Take any new Medicines after you have completely understood and accpet all the possible adverse reactions/side effects.   Please note  You were cared for by a hospitalist during your hospital stay. If you have any questions about your discharge medications or the care you received while you were in the hospital after you are discharged, you can call the unit and asked to speak with the hospitalist on call if the hospitalist that took care of you is not available. Once you are discharged, your primary care  physician will handle any further medical issues. Please note that NO REFILLS for any discharge medications will be authorized once you are discharged, as it is imperative that you return to your primary care physician (or establish a relationship with a primary care physician if you do not have one) for your aftercare needs so that they can reassess your need for medications and monitor your lab values.    On the day of Discharge: VITAL SIGNS:  Blood pressure 107/59, pulse 84, temperature 98.5 F (36.9 C), temperature source Oral, resp. rate 17, height 5\' 7"  (1.702 m), weight 78.019 kg (172 lb), SpO2 95 %. PHYSICAL EXAMINATION:  GENERAL:  64 y.o.-year-old patient lying in the bed with no acute distress.  EYES: Pupils equal, round, reactive to light and accommodation. No scleral icterus. Extraocular muscles intact.  HEENT: Head atraumatic, normocephalic. Oropharynx and nasopharynx clear.  NECK:  Supple, no jugular venous distention. No thyroid enlargement, no tenderness.  LUNGS: Normal breath sounds bilaterally, no wheezing, rales,rhonchi or crepitation. No use of accessory muscles of respiration.  CARDIOVASCULAR: S1, S2 normal. No murmurs, rubs, or gallops.  Chest wound from open bypass surgery.  Seem to be healing.  No signs of infection. ABDOMEN: Soft, non-tender, non-distended. Bowel sounds present. No organomegaly or mass.  EXTREMITIES: No pedal edema, cyanosis, or clubbing.  NEUROLOGIC: Cranial nerves II through XII are intact. Muscle strength 5/5 in all extremities. Sensation intact. Gait not checked.  PSYCHIATRIC: The patient is alert and oriented x 3.  SKIN: No obvious rash, lesion, or ulcer.  DATA REVIEW:   CBC  Recent Labs Lab 07/06/15 1245  WBC 8.4  HGB 11.2*  HCT 34.1*  PLT 230    Chemistries   Recent Labs Lab 07/06/15 1245  NA 137  K 4.4  CL 103  CO2 28  GLUCOSE 298*  BUN 12  CREATININE 0.65  CALCIUM 8.8*  AST 22  ALT 13*  ALKPHOS 67  BILITOT 0.3     RADIOLOGY:  Dg Chest Port 1 View  07/06/2015   CLINICAL DATA:  Chest pain, recent bypass surgery  EXAM: PORTABLE CHEST - 1 VIEW  COMPARISON:  None.  FINDINGS: Borderline cardiomegaly. Status post median sternotomy. No pulmonary edema. There is small left pleural effusion with left basilar atelectasis on infiltrate. Trace atelectasis right base.  IMPRESSION: Status post median sternotomy. No pulmonary edema. Small left pleural effusion with left basilar atelectasis or infiltrate. Trace right basilar atelectasis.   Electronically Signed   By: Natasha Mead M.D.   On: 07/06/2015 13:56   Management plans discussed with the patient, family and they are in agreement.  CODE STATUS: Full code  TOTAL TIME TAKING CARE OF THIS PATIENT: 55 minutes.    [facility], Mattis Featherly M.D on 07/08/2015 at 1:25 PM  Between 7am to 6pm - Pager - [phone]  After 6pm go to [email or web address] - password [email or web address] [facility] Hospitalists  Office  [phone]  CC: Primary care physician; No primary care provider on file.

## 2015-07-08 NOTE — Clinical Social Work Placement (Signed)
   CLINICAL SOCIAL WORK PLACEMENT  NOTE  Date:  07/08/2015  Patient Details  Name: Mario Peterson MRN: [id number] Date of Birth: Apr 10, 1951  Clinical Social Work is seeking post-discharge placement for this patient at the Skilled  Nursing Facility level of care (*CSW will initial, date and re-position this form in  chart as items are completed):  Yes   Patient/family provided with [facility] Clinical Social Work Department's list of facilities offering this level of care within the geographic area requested by the patient (or if unable, by the patient's family).  Yes   Patient/family informed of their freedom to choose among providers that offer the needed level of care, that participate in Medicare, Medicaid or managed care program needed by the patient, have an available bed and are willing to accept the patient.  Yes   Patient/family informed of [facility]'s ownership interest in [facility] and [facility], as well as of the fact that they are under no obligation to receive care at these facilities.  PASRR submitted to EDS on 07/08/15     PASRR number received on 07/08/15     Existing PASRR number confirmed on       FL2 transmitted to all facilities in geographic area requested by pt/family on 07/08/15     FL2 transmitted to all facilities within larger geographic area on       Patient informed that his/her managed care company has contracts with or will negotiate with certain facilities, including the following:        Yes   Patient/family informed of bed offers received.  Patient chooses bed at [facility]     Physician recommends and patient chooses bed at      Patient to be transferred to [facility] on 07/08/15.  Patient to be transferred to facility by car     Patient family notified on 07/08/15 of transfer.  Name of family member notified:  Laura/daughter     PHYSICIAN Please prepare priority discharge summary,  including medications     Additional Comment:    _______________________________________________ Delight Stare, LCSW 07/08/2015, 12:19 PM

## 2015-07-08 NOTE — Discharge Instructions (Signed)

## 2015-07-08 NOTE — Clinical Social Work Note (Signed)
Pt is ready for d/c.  CSW informed pt and daughter.  CSW extended bed offers.  Daughter selected Administrator.  Pt will be transported to [facility] by his daughter.  CSW provided RN with facility information in order to call report.  CSW signing off as there are no further needs.  [place], [place] [phone]

## 2015-07-08 NOTE — Progress Notes (Signed)
   07/08/15 1000  Clinical Encounter Type  Visited With Patient  Visit Type Initial  Referral From Nurse  Spiritual Encounters  Spiritual Needs Literature  Stress Factors  Patient Stress Factors Health changes  Family Stress Factors None identified  Chaplain Maisie Fus engaged patient in AD education. Patient stated he is waiting for daughter to look over documents when she comes to visit. Chaplain Maisie Fus

## 2015-07-08 NOTE — Progress Notes (Signed)
A&O. VSS. Tolerating diet well. 95% saturation on room air. Ambulating with assistance. Discharged per MD orders. Report called to [facility] place. IV removed per policy. Discharge paperwork packet sent to facility via pt's daughter. Discharged via wheelchair escorted by auxilary to [facility] place via daughter.

## 2015-07-18 ENCOUNTER — Other Ambulatory Visit
Admission: RE | Admit: 2015-07-18 | Discharge: 2015-07-18 | Disposition: A | Discharge status: Home / Self Care | Payer: Medicare Other | Source: Ambulatory Visit | Attending: Gerontology | Admitting: Gerontology

## 2015-07-18 DIAGNOSIS — Z87891 Personal history of nicotine dependence: Secondary | ICD-10-CM | POA: Insufficient documentation

## 2015-07-18 DIAGNOSIS — J449 Chronic obstructive pulmonary disease, unspecified: Secondary | ICD-10-CM | POA: Diagnosis present

## 2015-07-18 DIAGNOSIS — I10 Essential (primary) hypertension: Secondary | ICD-10-CM | POA: Insufficient documentation

## 2015-07-18 LAB — CBC WITH DIFFERENTIAL/PLATELET
BASOS PCT: 1 %
Basophils Absolute: 0.1 10*3/uL (ref 0–0.1)
EOS ABS: 0.7 10*3/uL (ref 0–0.7)
EOS PCT: 8 %
HCT: 32.7 % — ABNORMAL LOW (ref 40.0–52.0)
HEMOGLOBIN: 10.3 g/dL — AB (ref 13.0–18.0)
LYMPHS ABS: 2.4 10*3/uL (ref 1.0–3.6)
Lymphocytes Relative: 25 %
MCH: 24.9 pg — AB (ref 26.0–34.0)
MCHC: 31.5 g/dL — AB (ref 32.0–36.0)
MCV: 79 fL — ABNORMAL LOW (ref 80.0–100.0)
MONO ABS: 0.7 10*3/uL (ref 0.2–1.0)
MONOS PCT: 8 %
Neutro Abs: 5.4 10*3/uL (ref 1.4–6.5)
Neutrophils Relative %: 58 %
PLATELETS: 227 10*3/uL (ref 150–440)
RBC: 4.14 MIL/uL — ABNORMAL LOW (ref 4.40–5.90)
RDW: 16 % — AB (ref 11.5–14.5)
WBC: 9.3 10*3/uL (ref 3.8–10.6)

## 2015-07-18 LAB — COMPREHENSIVE METABOLIC PANEL
ALBUMIN: 3.1 g/dL — AB (ref 3.5–5.0)
ALK PHOS: 51 U/L (ref 38–126)
ALT: 9 U/L — ABNORMAL LOW (ref 17–63)
ANION GAP: 9 (ref 5–15)
AST: 18 U/L (ref 15–41)
BUN: 17 mg/dL (ref 6–20)
CALCIUM: 8.6 mg/dL — AB (ref 8.9–10.3)
CO2: 28 mmol/L (ref 22–32)
Chloride: 103 mmol/L (ref 101–111)
Creatinine, Ser: 0.95 mg/dL (ref 0.61–1.24)
GFR calc non Af Amer: >60 mL/min (ref >60)
Glucose, Bld: 174 mg/dL — ABNORMAL HIGH (ref 65–99)
POTASSIUM: 4.4 mmol/L (ref 3.5–5.1)
SODIUM: 140 mmol/L (ref 135–145)
Total Bilirubin: 0.5 mg/dL (ref 0.3–1.2)
Total Protein: 6.4 g/dL — ABNORMAL LOW (ref 6.5–8.1)

## 2015-09-02 ENCOUNTER — Ambulatory Visit: Payer: Medicare Other | Admitting: Dietician

## 2015-09-04 ENCOUNTER — Ambulatory Visit (INDEPENDENT_AMBULATORY_CARE_PROVIDER_SITE_OTHER): Payer: Medicare Other | Admitting: Licensed Clinical Social Worker

## 2015-09-04 DIAGNOSIS — F209 Schizophrenia, unspecified: Secondary | ICD-10-CM

## 2015-09-04 NOTE — Progress Notes (Signed)
Patient:   Mario Peterson Po   DOB:   Feb 21, 1951  MR Number:  [id number]  [facility]  [facility] [address] [place] [place] [id number] Dept: [phone]           Date of Service:   09/04/2015  Start Time:   2p End Time:   3p  Provider/Observer:  Marinda ElkNicole M Caroljean Monsivais Counselor       Billing Code/Service: [id number]  Behavioral Observation: Mario Peterson Geffert  presents as a 64 y.o.-year-old Caucasian Male who appeared his stated age. his dress was Appropriate and he was Disheveled and his manners were Appropriate to the situation.  There were any physical disabilities noted.  he displayed an appropriate level of cooperation and motivation.    Interactions:    Active   Attention:   within normal limits  Memory:   within normal limits  Speech (Volume):  normal  Speech:   normal volume  Thought Process:  Coherent  Though Content:  WNL  Orientation:   person, place, time/date and situation  Judgment:   Fair  Planning:   Fair  Affect:    Appropriate  Mood:    Irritable  Insight:   Fair  Intelligence:   normal  Chief Complaint:     Chief Complaint  Patient presents with  . Schizophrenia  . Establish Care    Reason for Service:  [place] [place] [facility]  Current Symptoms:  Hearing voices, received psychiatry since the 1990s. 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  Marijuana began age 64, smoked 2 months ago; 2 joints to assist with relaxation   Medical History:   Past Medical History  Diagnosis Date  . Diabetes mellitus without complication   . Hypertension   . COPD (chronic obstructive pulmonary disease)   . MI (myocardial infarction)           Medication List       This list is accurate as of: 09/04/15  2:20 PM.  Always use your most recent med list.               aspirin EC 81 MG tablet  Take 81 mg by mouth daily.     atorvastatin 80 MG tablet  Commonly  known as:  LIPITOR  Take 80 mg by mouth at bedtime.     bisoprolol 5 MG tablet  Commonly known as:  ZEBETA  Take 5 mg by mouth daily.     clopidogrel 75 MG tablet  Commonly known as:  PLAVIX  Take 75 mg by mouth daily.     isosorbide mononitrate 30 MG 24 hr tablet  Commonly known as:  IMDUR  Take 30 mg by mouth daily.     JANUVIA 50 MG tablet  Generic drug:  sitaGLIPtin  Take 50 mg by mouth daily.     LANTUS SOLOSTAR 100 UNIT/ML Solostar Pen  Generic drug:  Insulin Glargine  Inject 20 Units into the skin at bedtime.     metFORMIN 500 MG tablet  Commonly known as:  GLUCOPHAGE  Take 500 mg by mouth 2 (two) times daily.     metoprolol tartrate 25 MG tablet  Commonly known as:  LOPRESSOR  Take 25 mg by mouth 2 (two) times daily.     mirtazapine 15 MG tablet  Commonly known as:  REMERON  Take 15 mg by mouth at bedtime.     OLANZapine 10 MG tablet  Commonly known as:  ZYPREXA  Take 10 mg by mouth at bedtime.     oxycodone 30 MG immediate release tablet  Commonly known as:   ROXICODONE  Take 30 mg by mouth See admin instructions. Take 1 tablet by mouth up to 8 times a day as needed.     pantoprazole 40 MG tablet  Commonly known as:  PROTONIX  Take 40 mg by mouth daily.     SYMBICORT 160-4.5 MCG/ACT inhaler  Generic drug:  budesonide-formoterol  Inhale 2 puffs into the lungs daily.     tamsulosin 0.4 MG Caps capsule  Commonly known as:  FLOMAX  Take 0.4 mg by mouth daily.     valproic acid 250 MG capsule  Commonly known as:  DEPAKENE  Take 250 mg by mouth every 12 (twelve) hours.       **Denies current medication list**       Sexual History:   History  Sexual Activity  . Sexual Activity: Not on file     Abuse/Trauma History: denies   Psychiatric History:  "I been in the Psych Ward for 30 days in 1990"  Has been to various psychiatrist   Strengths:   Bets on sports (basketball, football)   Recovery Goals:  "To be able to relax, take it easy and be normal again.  I am not normal a lot times.  I go off on people if they look at me.  I sneak behind people and get them."  Hobbies/Interests:               Scientist, physiological (Sports)   Challenges/Barriers: "It's hard for me"    Family Med/Psych History: No family history on file.  Risk of Suicide/Violence: low   History of Suicide/Violence:  Reports that when people look at him to goes off; has difficluty walking and getting around  Psychosis:   Command Hallucinations; haven't heard voices in 2-3 months  Diagnosis:    Schizophrenia  Impression/DX:  Patient was dishelved and had poor hygiene.  Will address in therapy sessions.   Recommendation/Plan: Writer recommends Outpatient Therapy at least twice monthly to include but not limited to individual, group and or family therapy.  Medication Management is also recommended to assist with his mood.

## 2015-09-16 ENCOUNTER — Ambulatory Visit: Payer: Medicare Other | Admitting: Psychiatry

## 2015-09-26 ENCOUNTER — Telehealth: Payer: Self-pay | Admitting: *Deleted

## 2015-09-26 NOTE — Telephone Encounter (Signed)
I called Mario PenceMichael Dilorenzo to see if he is interested in Cardiac Rehab. [place]

## 2015-09-30 ENCOUNTER — Ambulatory Visit: Payer: Medicare Other | Admitting: Licensed Clinical Social Worker

## 2015-11-19 ENCOUNTER — Encounter: Payer: Self-pay | Admitting: Intensive Care

## 2015-11-19 ENCOUNTER — Emergency Department: Payer: Medicare Other

## 2015-11-19 ENCOUNTER — Inpatient Hospital Stay
Admission: EM | Admit: 2015-11-19 | Discharge: 2015-11-25 | DRG: 190 | Disposition: A | Discharge status: Skilled Nursing Facility | Payer: Medicare Other | Attending: Internal Medicine | Admitting: Internal Medicine

## 2015-11-19 DIAGNOSIS — Z7902 Long term (current) use of antithrombotics/antiplatelets: Secondary | ICD-10-CM

## 2015-11-19 DIAGNOSIS — R4781 Slurred speech: Secondary | ICD-10-CM | POA: Diagnosis present

## 2015-11-19 DIAGNOSIS — Z66 Do not resuscitate: Secondary | ICD-10-CM | POA: Diagnosis present

## 2015-11-19 DIAGNOSIS — Z79899 Other long term (current) drug therapy: Secondary | ICD-10-CM | POA: Diagnosis not present

## 2015-11-19 DIAGNOSIS — F17210 Nicotine dependence, cigarettes, uncomplicated: Secondary | ICD-10-CM | POA: Diagnosis present

## 2015-11-19 DIAGNOSIS — E119 Type 2 diabetes mellitus without complications: Secondary | ICD-10-CM

## 2015-11-19 DIAGNOSIS — R531 Weakness: Secondary | ICD-10-CM

## 2015-11-19 DIAGNOSIS — J189 Pneumonia, unspecified organism: Secondary | ICD-10-CM | POA: Diagnosis present

## 2015-11-19 DIAGNOSIS — J9601 Acute respiratory failure with hypoxia: Secondary | ICD-10-CM | POA: Diagnosis present

## 2015-11-19 DIAGNOSIS — Z794 Long term (current) use of insulin: Secondary | ICD-10-CM

## 2015-11-19 DIAGNOSIS — I5033 Acute on chronic diastolic (congestive) heart failure: Secondary | ICD-10-CM | POA: Diagnosis present

## 2015-11-19 DIAGNOSIS — I252 Old myocardial infarction: Secondary | ICD-10-CM | POA: Diagnosis not present

## 2015-11-19 DIAGNOSIS — R29810 Facial weakness: Secondary | ICD-10-CM | POA: Diagnosis present

## 2015-11-19 DIAGNOSIS — G8194 Hemiplegia, unspecified affecting left nondominant side: Secondary | ICD-10-CM | POA: Diagnosis present

## 2015-11-19 DIAGNOSIS — I110 Hypertensive heart disease with heart failure: Secondary | ICD-10-CM | POA: Diagnosis present

## 2015-11-19 DIAGNOSIS — J440 Chronic obstructive pulmonary disease with acute lower respiratory infection: Secondary | ICD-10-CM | POA: Diagnosis not present

## 2015-11-19 DIAGNOSIS — Z720 Tobacco use: Secondary | ICD-10-CM

## 2015-11-19 DIAGNOSIS — I2510 Atherosclerotic heart disease of native coronary artery without angina pectoris: Secondary | ICD-10-CM | POA: Diagnosis present

## 2015-11-19 DIAGNOSIS — R0902 Hypoxemia: Secondary | ICD-10-CM

## 2015-11-19 DIAGNOSIS — Z7984 Long term (current) use of oral hypoglycemic drugs: Secondary | ICD-10-CM

## 2015-11-19 DIAGNOSIS — J439 Emphysema, unspecified: Secondary | ICD-10-CM

## 2015-11-19 DIAGNOSIS — J441 Chronic obstructive pulmonary disease with (acute) exacerbation: Secondary | ICD-10-CM | POA: Diagnosis present

## 2015-11-19 DIAGNOSIS — I10 Essential (primary) hypertension: Secondary | ICD-10-CM

## 2015-11-19 DIAGNOSIS — Z7982 Long term (current) use of aspirin: Secondary | ICD-10-CM

## 2015-11-19 DIAGNOSIS — I639 Cerebral infarction, unspecified: Secondary | ICD-10-CM

## 2015-11-19 LAB — CBC WITH DIFFERENTIAL/PLATELET
BASOS ABS: 0.1 10*3/uL (ref 0–0.1)
BASOS PCT: 1 %
EOS ABS: 0.3 10*3/uL (ref 0–0.7)
EOS PCT: 2 %
HEMATOCRIT: 36.2 % — AB (ref 40.0–52.0)
Hemoglobin: 11.5 g/dL — ABNORMAL LOW (ref 13.0–18.0)
Lymphocytes Relative: 11 %
Lymphs Abs: 1.5 10*3/uL (ref 1.0–3.6)
MCH: 23.7 pg — ABNORMAL LOW (ref 26.0–34.0)
MCHC: 31.6 g/dL — AB (ref 32.0–36.0)
MCV: 74.9 fL — ABNORMAL LOW (ref 80.0–100.0)
MONO ABS: 0.8 10*3/uL (ref 0.2–1.0)
Monocytes Relative: 6 %
NEUTROS ABS: 10.6 10*3/uL — AB (ref 1.4–6.5)
Neutrophils Relative %: 80 %
PLATELETS: 223 10*3/uL (ref 150–440)
RBC: 4.84 MIL/uL (ref 4.40–5.90)
RDW: 19.3 % — AB (ref 11.5–14.5)
WBC: 13.2 10*3/uL — ABNORMAL HIGH (ref 3.8–10.6)

## 2015-11-19 LAB — COMPREHENSIVE METABOLIC PANEL
ALK PHOS: 59 U/L (ref 38–126)
ALT: 10 U/L — AB (ref 17–63)
ANION GAP: 8 (ref 5–15)
AST: 14 U/L — ABNORMAL LOW (ref 15–41)
Albumin: 3.8 g/dL (ref 3.5–5.0)
BILIRUBIN TOTAL: 0.5 mg/dL (ref 0.3–1.2)
BUN: 20 mg/dL (ref 6–20)
CALCIUM: 9.4 mg/dL (ref 8.9–10.3)
CO2: 32 mmol/L (ref 22–32)
CREATININE: 0.81 mg/dL (ref 0.61–1.24)
Chloride: 101 mmol/L (ref 101–111)
Glucose, Bld: 128 mg/dL — ABNORMAL HIGH (ref 65–99)
Potassium: 4.7 mmol/L (ref 3.5–5.1)
SODIUM: 141 mmol/L (ref 135–145)
TOTAL PROTEIN: 8.1 g/dL (ref 6.5–8.1)

## 2015-11-19 LAB — TROPONIN I
Troponin I: <0.03 ng/mL (ref <0.031)
Troponin I: <0.03 ng/mL (ref <0.031)

## 2015-11-19 LAB — GLUCOSE, CAPILLARY: Glucose-Capillary: 269 mg/dL — ABNORMAL HIGH (ref 65–99)

## 2015-11-19 MED ORDER — METOPROLOL TARTRATE 25 MG PO TABS
25.0000 mg | ORAL_TABLET | Freq: Two times a day (BID) | ORAL | Status: DC
  Administered 2015-11-19 – 2015-11-25 (×12): 25 mg via ORAL
  Filled 2015-11-19 (×12): qty 1

## 2015-11-19 MED ORDER — ENOXAPARIN SODIUM 40 MG/0.4ML ~~LOC~~ SOLN
40.0000 mg | SUBCUTANEOUS | Status: DC
  Administered 2015-11-20 – 2015-11-24 (×5): 40 mg via SUBCUTANEOUS
  Filled 2015-11-19 (×5): qty 0.4

## 2015-11-19 MED ORDER — BENZONATATE 100 MG PO CAPS: 100.0000 mg | ORAL_CAPSULE | Freq: Three times a day (TID) | ORAL | Status: DC | PRN

## 2015-11-19 MED ORDER — CEFTRIAXONE SODIUM 1 G IJ SOLR
1.0000 g | INTRAMUSCULAR | Status: DC
  Administered 2015-11-20 (×2): 1 g via INTRAVENOUS
  Filled 2015-11-19 (×3): qty 10

## 2015-11-19 MED ORDER — ATORVASTATIN CALCIUM 20 MG PO TABS
80.0000 mg | ORAL_TABLET | Freq: Every day | ORAL | Status: DC
  Administered 2015-11-20 – 2015-11-24 (×5): 80 mg via ORAL
  Filled 2015-11-19 (×5): qty 4

## 2015-11-19 MED ORDER — NICOTINE 21 MG/24HR TD PT24
21.0000 mg | MEDICATED_PATCH | Freq: Every day | TRANSDERMAL | Status: DC
Start: 2015-11-19 — End: 2015-11-25
  Administered 2015-11-20 – 2015-11-25 (×7): 21 mg via TRANSDERMAL
  Filled 2015-11-19 (×7): qty 1

## 2015-11-19 MED ORDER — METHYLPREDNISOLONE SODIUM SUCC 125 MG IJ SOLR
125.0000 mg | INTRAMUSCULAR | Status: AC
  Administered 2015-11-19: 125 mg via INTRAVENOUS
  Filled 2015-11-19: qty 2

## 2015-11-19 MED ORDER — ASPIRIN 81 MG PO CHEW
324.0000 mg | CHEWABLE_TABLET | Freq: Once | ORAL | Status: AC
Start: 2015-11-19 — End: 2015-11-19
  Administered 2015-11-19: 324 mg via ORAL
  Filled 2015-11-19: qty 4

## 2015-11-19 MED ORDER — SODIUM CHLORIDE 0.9 % IJ SOLN: 3.0000 mL | INTRAMUSCULAR | Status: DC | PRN

## 2015-11-19 MED ORDER — SODIUM CHLORIDE 0.9 % IV BOLUS (SEPSIS)
500.0000 mL | Freq: Once | INTRAVENOUS | Status: AC
  Administered 2015-11-19: 500 mL via INTRAVENOUS

## 2015-11-19 MED ORDER — SODIUM CHLORIDE 0.9 % IJ SOLN
3.0000 mL | Freq: Two times a day (BID) | INTRAMUSCULAR | Status: DC
  Administered 2015-11-20 – 2015-11-25 (×12): 3 mL via INTRAVENOUS

## 2015-11-19 MED ORDER — SODIUM CHLORIDE 0.9 % IV SOLN: 250.0000 mL | INTRAVENOUS | Status: DC | PRN

## 2015-11-19 MED ORDER — INSULIN ASPART 100 UNIT/ML ~~LOC~~ SOLN
0.0000 [IU] | Freq: Every day | SUBCUTANEOUS | Status: DC
  Administered 2015-11-20: 3 [IU] via SUBCUTANEOUS
  Filled 2015-11-19: qty 5
  Filled 2015-11-19: qty 3

## 2015-11-19 MED ORDER — BISOPROLOL FUMARATE 5 MG PO TABS
5.0000 mg | ORAL_TABLET | Freq: Every day | ORAL | Status: DC
  Administered 2015-11-20 – 2015-11-25 (×6): 5 mg via ORAL
  Filled 2015-11-19 (×7): qty 1

## 2015-11-19 MED ORDER — ASPIRIN 325 MG PO TABS
325.0000 mg | ORAL_TABLET | Freq: Every day | ORAL | Status: DC
  Administered 2015-11-20 – 2015-11-25 (×6): 325 mg via ORAL
  Filled 2015-11-19 (×6): qty 1

## 2015-11-19 MED ORDER — CLOPIDOGREL BISULFATE 75 MG PO TABS
75.0000 mg | ORAL_TABLET | Freq: Every day | ORAL | Status: DC
  Administered 2015-11-20 – 2015-11-25 (×6): 75 mg via ORAL
  Filled 2015-11-19 (×6): qty 1

## 2015-11-19 MED ORDER — PANTOPRAZOLE SODIUM 40 MG PO TBEC
40.0000 mg | DELAYED_RELEASE_TABLET | Freq: Every day | ORAL | Status: DC
  Administered 2015-11-20 – 2015-11-25 (×7): 40 mg via ORAL
  Filled 2015-11-19 (×7): qty 1

## 2015-11-19 MED ORDER — INSULIN ASPART 100 UNIT/ML ~~LOC~~ SOLN
0.0000 [IU] | Freq: Three times a day (TID) | SUBCUTANEOUS | Status: DC
  Administered 2015-11-20 (×2): 5 [IU] via SUBCUTANEOUS
  Filled 2015-11-19: qty 5

## 2015-11-19 MED ORDER — MOMETASONE FURO-FORMOTEROL FUM 100-5 MCG/ACT IN AERO: 2.0000 | INHALATION_SPRAY | Freq: Two times a day (BID) | RESPIRATORY_TRACT | Status: DC

## 2015-11-19 MED ORDER — HYDRALAZINE HCL 20 MG/ML IJ SOLN: 10.0000 mg | Freq: Four times a day (QID) | INTRAMUSCULAR | Status: DC | PRN

## 2015-11-19 MED ORDER — METHYLPREDNISOLONE SODIUM SUCC 125 MG IJ SOLR
60.0000 mg | Freq: Four times a day (QID) | INTRAMUSCULAR | Status: DC
  Administered 2015-11-20 – 2015-11-21 (×7): 60 mg via INTRAVENOUS
  Filled 2015-11-19 (×7): qty 2

## 2015-11-19 MED ORDER — IPRATROPIUM-ALBUTEROL 0.5-2.5 (3) MG/3ML IN SOLN
3.0000 mL | Freq: Once | RESPIRATORY_TRACT | Status: AC
  Administered 2015-11-19: 3 mL via RESPIRATORY_TRACT
  Filled 2015-11-19: qty 3

## 2015-11-19 MED ORDER — ISOSORBIDE MONONITRATE ER 30 MG PO TB24
30.0000 mg | ORAL_TABLET | Freq: Every day | ORAL | Status: DC
  Administered 2015-11-20 – 2015-11-25 (×6): 30 mg via ORAL
  Filled 2015-11-19 (×6): qty 1

## 2015-11-19 MED ORDER — BUDESONIDE-FORMOTEROL FUMARATE 160-4.5 MCG/ACT IN AERO
2.0000 | INHALATION_SPRAY | Freq: Two times a day (BID) | RESPIRATORY_TRACT | Status: DC
  Administered 2015-11-20 – 2015-11-25 (×12): 2 via RESPIRATORY_TRACT
  Filled 2015-11-19: qty 6

## 2015-11-19 MED ORDER — OLANZAPINE 10 MG PO TABS
10.0000 mg | ORAL_TABLET | Freq: Every day | ORAL | Status: DC
  Administered 2015-11-19 – 2015-11-24 (×6): 10 mg via ORAL
  Filled 2015-11-19 (×6): qty 1

## 2015-11-19 MED ORDER — MIRTAZAPINE 15 MG PO TABS
15.0000 mg | ORAL_TABLET | Freq: Every day | ORAL | Status: DC
  Administered 2015-11-19 – 2015-11-24 (×6): 15 mg via ORAL
  Filled 2015-11-19 (×6): qty 1

## 2015-11-19 MED ORDER — INSULIN GLARGINE 100 UNIT/ML ~~LOC~~ SOLN
20.0000 [IU] | Freq: Every day | SUBCUTANEOUS | Status: DC
  Administered 2015-11-20 (×2): 20 [IU] via SUBCUTANEOUS
  Filled 2015-11-19 (×3): qty 0.2

## 2015-11-19 MED ORDER — FUROSEMIDE 40 MG PO TABS
40.0000 mg | ORAL_TABLET | Freq: Every day | ORAL | Status: DC
  Administered 2015-11-20 – 2015-11-21 (×2): 40 mg via ORAL
  Filled 2015-11-19 (×3): qty 1

## 2015-11-19 MED ORDER — VALPROIC ACID 250 MG PO CAPS
250.0000 mg | ORAL_CAPSULE | Freq: Three times a day (TID) | ORAL | Status: DC
  Administered 2015-11-19 – 2015-11-25 (×17): 250 mg via ORAL
  Filled 2015-11-19 (×18): qty 1

## 2015-11-19 MED ORDER — TAMSULOSIN HCL 0.4 MG PO CAPS
0.4000 mg | ORAL_CAPSULE | Freq: Every day | ORAL | Status: DC
  Administered 2015-11-19 – 2015-11-24 (×6): 0.4 mg via ORAL
  Filled 2015-11-19 (×7): qty 1

## 2015-11-19 MED ORDER — DEXTROSE 5 % IV SOLN
500.0000 mg | INTRAVENOUS | Status: DC
  Administered 2015-11-20 (×2): 500 mg via INTRAVENOUS
  Filled 2015-11-19 (×3): qty 500

## 2015-11-19 NOTE — H&P (Signed)
[facility] [place] [facility]   PATIENT NAME: Mario Peterson    MR#:  [id number]  DATE OF BIRTH:  1951/07/21  DATE OF ADMISSION:  11/19/2015  PRIMARY CARE PHYSICIAN: No PCP Per Patient   REQUESTING/REFERRING PHYSICIAN: Sharyn Creamer, MD  CHIEF COMPLAINT:   Chief Complaint  Patient presents with  . Shortness of Breath   Shortness of breath and cough for 2 days HISTORY OF PRESENT ILLNESS:  Mario Peterson  is a 64 y.o. male with a known history of hypertension, diabetes, COPD and MI. The patient has had the shortness of breath, cough sputum and wheezing for the past 2 days. He also complains of fever and chills. Symptoms has been worsening today. He also complains of chest pain which is on the left side, dull, intermittent without radiation. He also complains of left side facial droop 2 weeks ago and slight weakness in his left arm and leg for 2 weeks. His WBC is 13,000, chest x-ray showed left pleural effusion with left atelectasis or infiltrate. Troponin level is normal.  PAST MEDICAL HISTORY:   Past Medical History  Diagnosis Date  . Diabetes mellitus without complication (HCC)   . Hypertension   . COPD (chronic obstructive pulmonary disease) (HCC)   . MI (myocardial infarction) (HCC)     PAST SURGICAL HISTORY:   Past Surgical History  Procedure Laterality Date  . Cardiac surgery      SOCIAL HISTORY:   Social History  Substance Use Topics  . Smoking status: Current Every Day Smoker -- 1.50 packs/day for 53 years  . Smokeless tobacco: Never Used  . Alcohol Use: No    FAMILY HISTORY:   Family History  Problem Relation Age of Onset  . Diabetes Mother   . Lung cancer Father     DRUG ALLERGIES:  No Known Allergies  REVIEW OF SYSTEMS:  CONSTITUTIONAL: No fever, has generalized weakness.  EYES: No blurred or double vision.  EARS, NOSE, AND THROAT: No tinnitus or ear pain.  RESPIRATORY: Has cough, shortness of breath, wheezing but no  hemoptysis.  CARDIOVASCULAR: Has chest pain, no orthopnea, edema.  GASTROINTESTINAL: No nausea, vomiting, diarrhea or abdominal pain.  GENITOURINARY: No dysuria, hematuria.  ENDOCRINE: No polyuria, nocturia,  HEMATOLOGY: No anemia, easy bruising or bleeding SKIN: No rash or lesion. MUSCULOSKELETAL: No joint pain or arthritis.   NEUROLOGIC: No tingling, numbness, weakness. Left-sided weakness and facial droop. PSYCHIATRY: No anxiety or depression.   MEDICATIONS AT HOME:   Prior to Admission medications   Medication Sig Start Date End Date Taking? Authorizing Provider  albuterol (PROVENTIL HFA;VENTOLIN HFA) 108 (90 Base) MCG/ACT inhaler Inhale 1-2 puffs into the lungs every 4 (four) hours as needed for wheezing or shortness of breath.    Yes Historical Provider, MD  atorvastatin (LIPITOR) 80 MG tablet Take 80 mg by mouth at bedtime.   Yes Historical Provider, MD  bisoprolol (ZEBETA) 5 MG tablet Take 5 mg by mouth daily.   Yes Historical Provider, MD  clopidogrel (PLAVIX) 75 MG tablet Take 75 mg by mouth daily.   Yes Historical Provider, MD  furosemide (LASIX) 40 MG tablet Take 40 mg by mouth daily.   Yes Historical Provider, MD  insulin glargine (LANTUS) 100 UNIT/ML injection Inject 20 Units into the skin at bedtime.   Yes Historical Provider, MD  isosorbide mononitrate (IMDUR) 30 MG 24 hr tablet Take 30 mg by mouth daily.   Yes Historical Provider, MD  metFORMIN (GLUCOPHAGE) 500 MG tablet Take  500 mg by mouth 2 (two) times daily.   Yes Historical Provider, MD  metoprolol tartrate (LOPRESSOR) 25 MG tablet Take 25 mg by mouth 2 (two) times daily.   Yes Historical Provider, MD  mirtazapine (REMERON) 15 MG tablet Take 15 mg by mouth at bedtime.   Yes Historical Provider, MD  OLANZapine (ZYPREXA) 10 MG tablet Take 10 mg by mouth at bedtime.   Yes Historical Provider, MD  omeprazole (PRILOSEC) 40 MG capsule Take 40 mg by mouth daily.   Yes Historical Provider, MD  sitaGLIPtin (JANUVIA) 100 MG  tablet Take 100 mg by mouth daily.   Yes Historical Provider, MD  SYMBICORT 160-4.5 MCG/ACT inhaler Inhale 2 puffs into the lungs 2 (two) times daily.    Yes Historical Provider, MD  tamsulosin (FLOMAX) 0.4 MG CAPS capsule Take 0.4 mg by mouth at bedtime.    Yes Historical Provider, MD  valproic acid (DEPAKENE) 250 MG capsule Take 250 mg by mouth 3 (three) times daily.    Yes Historical Provider, MD      VITAL SIGNS:  Blood pressure 162/64, pulse 97, temperature 99.4 F (37.4 C), temperature source Oral, resp. rate 37, weight 85.8 kg (189 lb 2.5 oz), SpO2 90 %.  PHYSICAL EXAMINATION:  GENERAL:  64 y.o.-year-old patient lying in the bed with no acute distress.  EYES: Pupils equal, round, reactive to light and accommodation. No scleral icterus. Extraocular muscles intact.  HEENT: Head atraumatic, normocephalic. Oropharynx and nasopharynx clear.  NECK:  Supple, no jugular venous distention. No thyroid enlargement, no tenderness.  LUNGS: Normal breath sounds bilaterally, bilateral expiratory wheezing, no rales,rhonchi or crepitation. No use of accessory muscles of respiration.  CARDIOVASCULAR: S1, S2 normal. No murmurs, rubs, or gallops.  ABDOMEN: Soft, nontender, nondistended. Bowel sounds present. No organomegaly or mass.  EXTREMITIES: No pedal edema, cyanosis, or clubbing.  NEUROLOGIC: Cranial nerves II through XII are intact. Muscle strength 5/5 in all extremities. Sensation intact. Gait not checked.  PSYCHIATRIC: The patient is alert and oriented x 3.  SKIN: No obvious rash, lesion, or ulcer.   LABORATORY PANEL:   CBC  Recent Labs Lab 11/19/15 1740  WBC 13.2*  HGB 11.5*  HCT 36.2*  PLT 223   ------------------------------------------------------------------------------------------------------------------  Chemistries   Recent Labs Lab 11/19/15 1740  NA 141  K 4.7  CL 101  CO2 32  GLUCOSE 128*  BUN 20  CREATININE 0.81  CALCIUM 9.4  AST 14*  ALT 10*  ALKPHOS 59   BILITOT 0.5   ------------------------------------------------------------------------------------------------------------------  Cardiac Enzymes  Recent Labs Lab 11/19/15 1740  TROPONINI <0.03   ------------------------------------------------------------------------------------------------------------------  RADIOLOGY:  Ct Head Wo Contrast  11/19/2015  CLINICAL DATA:  Left-sided weakness for 2 weeks with left-sided facial droop EXAM: CT HEAD WITHOUT CONTRAST TECHNIQUE: Contiguous axial images were obtained from the base of the skull through the vertex without intravenous contrast. COMPARISON:  None. FINDINGS: Bony structures are within normal limits. Some mild motion artifact is noted. Diffuse atrophic changes are seen. Mild chronic white matter ischemic changes noted. No findings to suggest acute hemorrhage, acute infarction or space-occupying mass lesion are noted. IMPRESSION: Chronic atrophic and ischemic changes without acute abnormality. Electronically Signed   By: Alcide Clever M.D.   On: 11/19/2015 17:55   Dg Chest Port 1 View  11/19/2015  CLINICAL DATA:  Shortness of breath, cough EXAM: PORTABLE CHEST 1 VIEW COMPARISON:  07/06/2015 FINDINGS: Prior CABG. Cardiomegaly. Left lower lobe atelectasis or infiltrate with left effusion, similar to prior study. Small right  effusion with right base atelectasis. No overt edema/failure. No acute bony abnormality. IMPRESSION: Chronic left pleural effusion with left basilar atelectasis or infiltrate. Small right pleural effusion with right base atelectasis. Electronically Signed   By: Charlett NoseKevin  Dover M.D.   On: 11/19/2015 18:23    EKG:   Orders placed or performed during the hospital encounter of 11/19/15  . EKG 12-Lead  . EKG 12-Lead    IMPRESSION AND PLAN:   Pneumonia (CAP) with leukocytosis Zithromax and Rocephin, follow-up CBC, blood culture and sputum culture.  COPD exacerbation. Continue IV Solu-Medrol, continue antibiotics, DuoNeb  when necessary and dulera. Chest pain. The patient was treated with aspirin 324 mg 1 dose in the ED, I will continue aspirin and follow-up troponin level. Hypertension. Continue home hypertension medication with hydralazine IV when necessary. Diabetes 2. Start sliding scale and continue Lantus, hold by mouth diabetes medication.  Tobacco abuse. Smoking cessation was counseled for 3-4 minutes, nicotine patch.  All the records are reviewed and case discussed with ED provider. Management plans discussed with the patient, family and they are in agreement.  CODE STATUS: DO NOT RESUSCITATE  TOTAL TIME TAKING CARE OF THIS PATIENT: 56 minutes.    Shaune Pollackhen, Penn Grissett M.D on 11/19/2015 at 8:15 PM  Between 7am to 6pm - Pager - [phone]  After 6pm go to [email or web address] - password [email or web address] [facility]  [facility]  Office  [phone]  CC: Primary care physician; No PCP Per Patient

## 2015-11-19 NOTE — ED Notes (Signed)
Patient arrived by EMS from home. Patient presents to ER with c/o chest pain for a couple of days, SOB x2 hours ago, blurry vision, cough X1 month, and weakness X2 weeks

## 2015-11-19 NOTE — ED Notes (Signed)
Attempted to call report. Was told nurse would call be back due to bed not being approved

## 2015-11-19 NOTE — ED Provider Notes (Signed)
[facility] Emergency Department Provider Note REMINDER - THIS NOTE IS NOT A FINAL MEDICAL RECORD UNTIL IT IS SIGNED. UNTIL THEN, THE CONTENT BELOW MAY REFLECT INFORMATION FROM A DOCUMENTATION TEMPLATE, NOT THE ACTUAL PATIENT VISIT. ____________________________________________  Time seen: Approximately 5:28 PM  I have reviewed the triage vital signs and the nursing notes.   HISTORY  Chief Complaint Shortness of Breath    HPI Mario Peterson is a 64 y.o. male history of diabetes hypertension COPD and previous heart attack. He reports that this evening he started feeling short of breath, fevers and chilled. He has been having a productive cough and developed sudden shortness of breath starting about 3 hours prior to arrival. Reports he feels that he is having a COPD attack.  In addition he reports that his friends and family have told him that they thought his left side of his face was droopy starting 2 weeks ago and he's been having some slight weakness in his left arm and leg for over 2 weeks.  Denies any pain. Specifically no chest pain. No abdominal pain. Previous history of COPD. Use inhalers at home. Denies recent antibiotics or admission to the hospital.   Past Medical History  Diagnosis Date  . Diabetes mellitus without complication (HCC)   . Hypertension   . COPD (chronic obstructive pulmonary disease) (HCC)   . MI (myocardial infarction) [facility])     Patient Active Problem List   Diagnosis Date Noted  . Chest pain 07/06/2015    Past Surgical History  Procedure Laterality Date  . Cardiac surgery      Current Outpatient Rx  Name  Route  Sig  Dispense  Refill  . insulin glargine (LANTUS) 100 UNIT/ML injection   Subcutaneous   Inject 20 Units into the skin at bedtime.         . tamsulosin (FLOMAX) 0.4 MG CAPS capsule   Oral   Take 0.4 mg by mouth at bedtime.          . valproic acid (DEPAKENE) 250 MG capsule   Oral   Take 250 mg by  mouth 3 (three) times daily.          Marland Kitchen albuterol (PROVENTIL HFA;VENTOLIN HFA) 108 (90 Base) MCG/ACT inhaler   Inhalation   Inhale 2 puffs into the lungs every 4 (four) hours as needed for wheezing or shortness of breath.         Marland Kitchen aspirin EC 81 MG tablet   Oral   Take 81 mg by mouth daily.         Marland Kitchen atorvastatin (LIPITOR) 80 MG tablet   Oral   Take 80 mg by mouth at bedtime.         . bisoprolol (ZEBETA) 5 MG tablet   Oral   Take 5 mg by mouth daily.         . clopidogrel (PLAVIX) 75 MG tablet   Oral   Take 75 mg by mouth daily.         . furosemide (LASIX) 40 MG tablet   Oral   Take 40 mg by mouth daily.         . isosorbide mononitrate (IMDUR) 30 MG 24 hr tablet   Oral   Take 30 mg by mouth daily.         . metFORMIN (GLUCOPHAGE) 500 MG tablet   Oral   Take 500 mg by mouth 2 (two) times daily.         Marland Kitchen  metoprolol tartrate (LOPRESSOR) 25 MG tablet   Oral   Take 25 mg by mouth 2 (two) times daily.         . mirtazapine (REMERON) 15 MG tablet   Oral   Take 15 mg by mouth at bedtime.         Marland Kitchen. OLANZapine (ZYPREXA) 10 MG tablet   Oral   Take 10 mg by mouth at bedtime.         Marland Kitchen. omeprazole (PRILOSEC) 40 MG capsule   Oral   Take 40 mg by mouth daily.         Marland Kitchen. oxycodone (ROXICODONE) 30 MG immediate release tablet   Oral   Take 30 mg by mouth See admin instructions. Take 1 tablet by mouth up to 8 times a day as needed.         . pantoprazole (PROTONIX) 40 MG tablet   Oral   Take 40 mg by mouth daily.         . sitaGLIPtin (JANUVIA) 100 MG tablet   Oral   Take 100 mg by mouth daily.         . SYMBICORT 160-4.5 MCG/ACT inhaler   Inhalation   Inhale 2 puffs into the lungs daily.           Dispense as written.     Allergies Review of patient's allergies indicates no known allergies.  History reviewed. No pertinent family history.  Social History Social History  Substance Use Topics  . Smoking status: Current Every  Day Smoker  . Smokeless tobacco: Never Used  . Alcohol Use: No    Review of Systems Constitutional: Fevers and chills, generally tired Eyes: No visual changes. ENT: No sore throat. Cardiovascular: Denies chest pain. Respiratory: See history of present illness Gastrointestinal: No abdominal pain.  No nausea, no vomiting.  No diarrhea.  No constipation. Genitourinary: Negative for dysuria. Musculoskeletal: Negative for back pain. Skin: Negative for rash. \ Denies home oxygen use. States he feels better on oxygen. Patient notably saturating 88% with talking during exam. Good waveform.  Neurological: Negative for headaches, focal weakness or numbness.  10-point ROS otherwise negative.  ____________________________________________   PHYSICAL EXAM:  VITAL SIGNS: ED Triage Vitals  Enc Vitals Group     BP 11/19/15 1722 181/67 mmHg     Pulse Rate 11/19/15 1722 82     Resp --      Temp 11/19/15 1722 99.4 F (37.4 C)     Temp Source 11/19/15 1722 Oral     SpO2 11/19/15 1722 94 %     Weight 11/19/15 1722 189 lb 2.5 oz (85.8 kg)     Height --      Head Cir --      Peak Flow --      Pain Score 11/19/15 1723 8     Pain Loc --      Pain Edu? --      Excl. in GC? --    Constitutional: Alert and oriented. Moderately dyspneic.  Eyes: Conjunctivae are normal. PERRL. EOMI. Head: Atraumatic. Nose: No congestion/rhinnorhea. Mouth/Throat: Mucous membranes are moist.  Oropharynx non-erythematous. Neck: No stridor.   Cardiovascular: Normal rate, regular rhythm. Grossly normal heart sounds.  Good peripheral circulation. Respiratory: Moderate increased work of breathing, and expiratory wheezing throughout. Minimal rales in bilateral lung fields. Gastrointestinal: Soft and nontender. No distention. No abdominal bruits. No CVA tenderness. Musculoskeletal: No lower extremity tenderness nor edema.  No joint effusions. Neurologic:  Normal speech and language.  There are mild left facial droop  noted primarily in the corner of the left face. Questionably some very minimal weakness in the left upper arm and left lower leg versus the right. No pronator drift. Normal sensation over the face arms and legs. No ataxia. Extra ocular movements intact. Visual field cuts. Skin:  Skin is warm, dry and intact. No rash noted. Psychiatric: Mood and affect are normal. Speech and behavior are normal.  ____________________________________________   LABS (all labs ordered are listed, but only abnormal results are displayed)  Labs Reviewed  CBC WITH DIFFERENTIAL/PLATELET - Abnormal; Notable for the following:    WBC 13.2 (*)    Hemoglobin 11.5 (*)    HCT 36.2 (*)    MCV 74.9 (*)    MCH 23.7 (*)    MCHC 31.6 (*)    RDW 19.3 (*)    Neutro Abs 10.6 (*)    All other components within normal limits  COMPREHENSIVE METABOLIC PANEL - Abnormal; Notable for the following:    Glucose, Bld 128 (*)    AST 14 (*)    ALT 10 (*)    All other components within normal limits  TROPONIN I   ____________________________________________  EKG  EKG reviewed and interpreted by me at 1930 No evidence of acute ST elevation or obvious ischemic change Heart rate 90 PR 182 QTc 450 QRS 106 Normal sinus rhythm, no obvious ischemic abnormality  ____________________________________________  RADIOLOGY  DG Chest Port 1 View (Final result) Result time: 11/19/15 18:23:49   Final result by Rad Results In Interface (11/19/15 18:23:49)   Narrative:   CLINICAL DATA: Shortness of breath, cough  EXAM: PORTABLE CHEST 1 VIEW  COMPARISON: 07/06/2015  FINDINGS: Prior CABG. Cardiomegaly. Left lower lobe atelectasis or infiltrate with left effusion, similar to prior study. Small right effusion with right base atelectasis. No overt edema/failure. No acute bony abnormality.  IMPRESSION: Chronic left pleural effusion with left basilar atelectasis or infiltrate.  Small right pleural effusion with right base  atelectasis.   Electronically Signed By: Charlett Nose M.D. On: 11/19/2015 18:23          CT Head Wo Contrast (Final result) Result time: 11/19/15 17:55:26   Final result by Rad Results In Interface (11/19/15 17:55:26)   Narrative:   CLINICAL DATA: Left-sided weakness for 2 weeks with left-sided facial droop  EXAM: CT HEAD WITHOUT CONTRAST  TECHNIQUE: Contiguous axial images were obtained from the base of the skull through the vertex without intravenous contrast.  COMPARISON: None.  FINDINGS: Bony structures are within normal limits. Some mild motion artifact is noted. Diffuse atrophic changes are seen. Mild chronic white matter ischemic changes noted. No findings to suggest acute hemorrhage, acute infarction or space-occupying mass lesion are noted.  IMPRESSION: Chronic atrophic and ischemic changes without acute abnormality.   Electronically Signed By: Alcide Clever M.D. On: 11/19/2015 17:55    ____________________________________________   PROCEDURES  Procedure(s) performed: None  Critical Care performed: No  ____________________________________________   INITIAL IMPRESSION / ASSESSMENT AND PLAN / ED COURSE  Pertinent labs & imaging results that were available during my care of the patient were reviewed by me and considered in my medical decision making (see chart for details).  Patient presents with cough, wheezing, and dyspnea. Also associated with 2 weeks reported left-sided weakness and facial droop. Currently outside TPA window due to timeframe.  Chest x-ray reveals questionable infiltrate, given low-grade temperature, leukocytosis, hypoxia and increased work of breathing I will treat him for possible community-acquired pneumonia. Patient  responded well to nebulizer treatments, currently feeling improved with improved saturations on 2 L oxygen at 7:30. Fully awake and alert.  We'll admit the patient for COPD, severe hypoxia and  probable underlying pneumonia. In addition we'll need further evaluation for left-sided weakness, unclear if this represents a stroke as CT of the head does not reveal any acute abnormality. Anticipate neurology evaluation, will give aspirin for now. ____________________________________________   FINAL CLINICAL IMPRESSION(S) / ED DIAGNOSES  Final diagnoses:  Community acquired pneumonia  COPD exacerbation (HCC)  Left-sided weakness      Sharyn Creamer, MD 11/19/15 1934

## 2015-11-19 NOTE — Progress Notes (Signed)
ANTIBIOTIC CONSULT NOTE - INITIAL  Pharmacy Consult for antibiotic renal adjustment  No Known Allergies  Patient Measurements: Weight: 189 lb 2.5 oz (85.8 kg)  Labs:  Recent Labs  11/19/15 1740  WBC 13.2*  HGB 11.5*  PLT 223  CREATININE 0.81   Estimated Creatinine Clearance: 93.9 mL/min (by C-G formula based on Cr of 0.81).  Assessment: Pharmacy consulted to monitor antibiotics and adjust based on renal function if needed.   Patient currently prescribed azithromycin 500 mg IV daily and ceftriaxone 1 g IV daily for CAP. Patient's CrCl is ~94 mL/min.  Plan:  Continue current regimen of azithromycin 500 mg IV daily and ceftriaxone 1 g IV daily.  Pharmacy will continue to monitor, thank you for the consult.  Jodelle RedMary M Jaxan Michel 11/19/2015,8:31 PM

## 2015-11-19 NOTE — ED Notes (Signed)
Patient transported to CT 

## 2015-11-20 ENCOUNTER — Inpatient Hospital Stay: Payer: Medicare Other

## 2015-11-20 LAB — GLUCOSE, CAPILLARY
GLUCOSE-CAPILLARY: 258 mg/dL — AB (ref 65–99)
GLUCOSE-CAPILLARY: 270 mg/dL — AB (ref 65–99)
GLUCOSE-CAPILLARY: 295 mg/dL — AB (ref 65–99)
GLUCOSE-CAPILLARY: 352 mg/dL — AB (ref 65–99)
GLUCOSE-CAPILLARY: 458 mg/dL — AB (ref 65–99)
GLUCOSE-CAPILLARY: 487 mg/dL — AB (ref 65–99)
GLUCOSE-CAPILLARY: 496 mg/dL — AB (ref 65–99)
Glucose-Capillary: 407 mg/dL — ABNORMAL HIGH (ref 65–99)
Glucose-Capillary: 410 mg/dL — ABNORMAL HIGH (ref 65–99)
Glucose-Capillary: 455 mg/dL — ABNORMAL HIGH (ref 65–99)
Glucose-Capillary: 481 mg/dL — ABNORMAL HIGH (ref 65–99)
Glucose-Capillary: 461 mg/dL — ABNORMAL HIGH (ref 65–99)

## 2015-11-20 LAB — STREP PNEUMONIAE URINARY ANTIGEN: Strep Pneumo Urinary Antigen: NEGATIVE

## 2015-11-20 LAB — TROPONIN I
Troponin I: <0.03 ng/mL (ref <0.031)
Troponin I: <0.03 ng/mL (ref <0.031)

## 2015-11-20 LAB — GLUCOSE, RANDOM: Glucose, Bld: 458 mg/dL — ABNORMAL HIGH (ref 65–99)

## 2015-11-20 MED ORDER — INSULIN ASPART 100 UNIT/ML ~~LOC~~ SOLN
24.0000 [IU] | Freq: Once | SUBCUTANEOUS | Status: AC
  Administered 2015-11-20: 24 [IU] via SUBCUTANEOUS
  Filled 2015-11-20: qty 24

## 2015-11-20 MED ORDER — INSULIN ASPART 100 UNIT/ML ~~LOC~~ SOLN: 0.0000 [IU] | Freq: Every day | SUBCUTANEOUS | Status: DC

## 2015-11-20 MED ORDER — INSULIN ASPART 100 UNIT/ML ~~LOC~~ SOLN
0.0000 [IU] | Freq: Three times a day (TID) | SUBCUTANEOUS | Status: DC
  Administered 2015-11-21: 15 [IU] via SUBCUTANEOUS
  Filled 2015-11-20: qty 15

## 2015-11-20 MED ORDER — INSULIN ASPART 100 UNIT/ML ~~LOC~~ SOLN
16.0000 [IU] | SUBCUTANEOUS | Status: AC
  Administered 2015-11-20: 16 [IU] via SUBCUTANEOUS
  Filled 2015-11-20: qty 16

## 2015-11-20 NOTE — Progress Notes (Signed)
Inpatient Diabetes Program Recommendations  AACE/ADA: New Consensus Statement on Inpatient Glycemic Control (2015)  Target Ranges:  Prepandial:   less than 140 mg/dL      Peak postprandial:   less than 180 mg/dL (1-2 hours)      Critically ill patients:  140 - 180 mg/dL   Review of Glycemic Control  Results for Mario Peterson, Mario Peterson (MRN [id number]) as of 11/20/2015 11:38  Ref. Range 11/19/2015 20:52 11/20/2015 00:27 11/20/2015 07:39  Glucose-Capillary Latest Ref Range: 65-99 mg/dL 409269 (H) 811258 (H) 914295 (H)    Diabetes history: Type 2 Outpatient Diabetes medications: Lantus 20 units qday, Metformin 500mg  bid, Januvia 100mg  qday Current orders for Inpatient glycemic control:Lantus 20 units qday, Novolog 0-9 units tid, Novolog 0-5 units qhs  * steroids q6h  Inpatient Diabetes Program Recommendations: while patient is on steroids, consider increasing Lantus to 25 units qhs and increasing Novolog correction to the resistant scale 0-20 units tid.   Susette RacerJulie Tyrisha Benninger, RN, BA, MHA, CDE Diabetes Coordinator Inpatient Diabetes Program  [phone] (Team Pager) [phone] [facility] Office) 11/20/2015 11:43 AM

## 2015-11-20 NOTE — Evaluation (Signed)
Clinical/Bedside Swallow Evaluation Patient Details  Name: Mario Peterson MRN: [id number] Date of Birth: March 06, 1951  Today's Date: 11/20/2015 Time: SLP Start Time (ACUTE ONLY): 1055 SLP Stop Time (ACUTE ONLY): 1135 SLP Time Calculation (min) (ACUTE ONLY): 40 min  Past Medical History:  Past Medical History  Diagnosis Date  . Diabetes mellitus without complication (HCC)   . Hypertension   . COPD (chronic obstructive pulmonary disease) (HCC)   . MI (myocardial infarction) [facility](HCC)    Past Surgical History:  Past Surgical History  Procedure Laterality Date  . Cardiac surgery     HPI:  Mario PenceMichael Scalera is a 64 y.o. male with a known history of hypertension, diabetes, COPD and MI. The patient has had the shortness of breath, cough sputum and wheezing for the past 2 days. He also complains of fever and chills. Symptoms has been worsening today. He also complains of chest pain which is on the left side, dull, intermittent without radiation. He also complains of left side facial droop 2 weeks ago and slight weakness in his left arm and leg for 2 weeks. His WBC is 13,000, chest x-ray showed left pleural effusion with left atelectasis or infiltrate. Troponin level is normal.. Pt denies any swallowing problems. Pt did state that he takes his meds with puree because he has to take so many and [place] in puree.    Assessment / Plan / Recommendation Clinical Impression  Pt presents w/no apparent oropharyngeal dysphagia and min aspiration risk d/t current pneumonia. Pt demonstrated no overt s/s of aspiration w/any tested consistency. Vocal quality remained clear throughout and laryngeal elevation appeared adequate.  Pt was noted to have delayed cough during PO trials, however pt was coughing prior to evaluation and cough was not consistent with any tested consistency. Oral phase was within functional limits for tested consistencies. Pt was edentulous, but was  able to masticate solid and clear. No pocketing or holding was observed. Oral mech exam revealed left facial droop and decreased facial ROM, however did not appear to functionally impact swallow. Pt also reported he was unaware of left facial droop until pt's friends/family mentioned it. Recommend continue w/regular diet w/thin liquids. General aspiration precautions, meds whole  in puree. Will f/u re: toleration of diet.     Aspiration Risk  Mild aspiration risk    Diet Recommendation Regular;Thin liquid   Liquid Administration via: Cup;Straw Medication Administration: Whole meds with puree Supervision: Patient able to self feed Compensations: Minimize environmental distractions;Slow rate;Small sips/bites Postural Changes: Seated upright at 90 degrees    Other  Recommendations Oral Care Recommendations: Oral care BID;Patient independent with oral care   Follow up Recommendations  Other (comment) (TBD)    Frequency and Duration min 1 x/week  1 week       Prognosis Prognosis for Safe Diet Advancement: Good      Swallow Study   General Date of Onset: 11/20/15 HPI: Mario PenceMichael Brethauer is a 64 y.o. male with a known history of hypertension, diabetes, COPD and MI. The patient has had the shortness of breath, cough sputum and wheezing for the past 2 days. He also complains of fever and chills. Symptoms has been worsening today. He also complains of chest pain which is on the left side, dull, intermittent without radiation. He also complains of left side facial droop 2 weeks ago and slight weakness in his left arm and leg for 2 weeks. His WBC is 13,000, chest x-ray showed left pleural  effusion with left atelectasis or infiltrate. Troponin level is normal.. Pt denies any swallowing problems. Pt did state that he takes his meds with puree because he has to take so many and the "place he stayed in [place]" recommended he take them in puree.  Type of Study: Bedside Swallow Evaluation Previous Swallow  Assessment: None reported Diet Prior to this Study: Regular;Thin liquids Temperature Spikes Noted: N/A Respiratory Status: Nasal cannula History of Recent Intubation: No Behavior/Cognition: Alert;Cooperative;Pleasant mood Oral Cavity Assessment: Within Functional Limits Oral Care Completed by SLP: No Oral Cavity - Dentition: Edentulous Vision: Functional for self-feeding Self-Feeding Abilities: Able to feed self Patient Positioning: Upright in bed Baseline Vocal Quality: Normal Volitional Cough: Strong Volitional Swallow: Able to elicit    Oral/Motor/Sensory Function Overall Oral Motor/Sensory Function: Mild impairment Facial ROM: Reduced left Facial Symmetry: Abnormal symmetry left Facial Strength: Within Functional Limits Facial Sensation: Within Functional Limits Lingual ROM: Within Functional Limits Lingual Symmetry: Within Functional Limits Lingual Strength: Within Functional Limits Lingual Sensation: Within Functional Limits Velum: Within Functional Limits Mandible: Within Functional Limits   Ice Chips Ice chips: Not tested   Thin Liquid Thin Liquid: Within functional limits Presentation: Cup;Self Fed;Straw Other Comments: 3 ounces    Nectar Thick Nectar Thick Liquid: Not tested   Honey Thick Honey Thick Liquid: Not tested   Puree Puree: Within functional limits Presentation: Self Fed;Spoon Other Comments: 3 tsps   Solid   GO    Solid: Within functional limits Presentation: Self Fed Other Comments: one bite of solid cracker       [place],Maaran 11/20/2015,11:38 AM

## 2015-11-20 NOTE — Progress Notes (Signed)
[facility] - [place] at [facility]   PATIENT NAME: Mario Peterson    MR#:  [id number]  DATE OF BIRTH:  [birth year]  SUBJECTIVE:  CHIEF COMPLAINT:   Chief Complaint  Patient presents with  . Shortness of Breath   patient is 64 year old Caucasian male with past history significant for history of COPD, diabetes, hypertension, coronary artery disease who presents to the hospital with complaints of cough, shortness of breath, wheezing, fevers, chills and left-sided weakness, also left facial droop. He was admitted to the hospital for further evaluation and treatment. She says that it revealed left pleural effusion and is lower atelectasis bilaterally. CT of head was unremarkable.   Review of Systems  Constitutional: Positive for malaise/fatigue. Negative for fever, chills and weight loss.  HENT: Negative for congestion.   Eyes: Negative for blurred vision and double vision.  Respiratory: Positive for cough, sputum production, shortness of breath and wheezing.   Cardiovascular: Negative for chest pain, palpitations, orthopnea, leg swelling and PND.  Gastrointestinal: Negative for nausea, vomiting, abdominal pain, diarrhea, constipation and blood in stool.  Genitourinary: Negative for dysuria, urgency, frequency and hematuria.  Musculoskeletal: Negative for falls.  Neurological: Positive for focal weakness. Negative for dizziness, tremors and headaches.  Endo/Heme/Allergies: Does not bruise/bleed easily.  Psychiatric/Behavioral: Negative for depression. The patient does not have insomnia.     VITAL SIGNS: Blood pressure 162/55, pulse 71, temperature 97.4 F (36.3 C), temperature source Oral, resp. rate 20, height  (1.727 m), weight 84.324 kg (185 lb 14.4 oz), SpO2 93 %.  PHYSICAL EXAMINATION:   GENERAL:  64 y.o.-year-old patient lying in the bed with no acute distress. Left facial droop. Slurring speech EYES: Pupils equal, round, reactive to light and  accommodation. No scleral icterus. Extraocular muscles intact.  HEENT: Head atraumatic, normocephalic. Oropharynx and nasopharynx clear.  NECK:  Supple, no jugular venous distention. No thyroid enlargement, no tenderness.  LUNGS: Mildly diminished breath sounds bilaterally, intermittent wheezing, few rales,rhonchi or crepitation. On auscultation. No use of accessory muscles of respiration.  CARDIOVASCULAR: S1, S2 normal. No murmurs, rubs, or gallops.  ABDOMEN: Soft, nontender, nondistended. Bowel sounds present. No organomegaly or mass.  EXTREMITIES: No pedal edema, cyanosis, or clubbing.  NEUROLOGIC: Cranial nerves left facial droop. Muscle strength 4/5 in left-sided extremities. Sensation intact. Gait not checked.  PSYCHIATRIC: The patient is alert and oriented x 3.  SKIN: No obvious rash, lesion, or ulcer.   ORDERS/RESULTS REVIEWED:   CBC  Recent Labs Lab 11/19/15 1740  WBC 13.2*  HGB 11.5*  HCT 36.2*  PLT 223  MCV 74.9*  MCH 23.7*  MCHC 31.6*  RDW 19.3*  LYMPHSABS 1.5  MONOABS 0.8  EOSABS 0.3  BASOSABS 0.1   ------------------------------------------------------------------------------------------------------------------  Chemistries   Recent Labs Lab 11/19/15 1740  NA 141  K 4.7  CL 101  CO2 32  GLUCOSE 128*  BUN 20  CREATININE 0.81  CALCIUM 9.4  AST 14*  ALT 10*  ALKPHOS 59  BILITOT 0.5   ------------------------------------------------------------------------------------------------------------------ estimated creatinine clearance is 94.9 mL/min (by C-G formula based on Cr of 0.81). ------------------------------------------------------------------------------------------------------------------ No results for input(s): TSH, T4TOTAL, T3FREE, THYROIDAB in the last 72 hours.  Invalid input(s): FREET3  Cardiac Enzymes  Recent Labs Lab 11/19/15 2223 11/20/15 0155 11/20/15 0815  TROPONINI <0.03 <0.03 <0.03    ------------------------------------------------------------------------------------------------------------------ Invalid input(s): POCBNP ---------------------------------------------------------------------------------------------------------------  RADIOLOGY: Ct Head Wo Contrast  11/19/2015  CLINICAL DATA:  Left-sided weakness for 2 weeks with left-sided facial droop EXAM: CT HEAD WITHOUT CONTRAST  TECHNIQUE: Contiguous axial images were obtained from the base of the skull through the vertex without intravenous contrast. COMPARISON:  None. FINDINGS: Bony structures are within normal limits. Some mild motion artifact is noted. Diffuse atrophic changes are seen. Mild chronic white matter ischemic changes noted. No findings to suggest acute hemorrhage, acute infarction or space-occupying mass lesion are noted. IMPRESSION: Chronic atrophic and ischemic changes without acute abnormality. Electronically Signed   By: Alcide CleverMark  Lukens M.D.   On: 11/19/2015 17:55   Dg Chest Port 1 View  11/19/2015  CLINICAL DATA:  Shortness of breath, cough EXAM: PORTABLE CHEST 1 VIEW COMPARISON:  07/06/2015 FINDINGS: Prior CABG. Cardiomegaly. Left lower lobe atelectasis or infiltrate with left effusion, similar to prior study. Small right effusion with right base atelectasis. No overt edema/failure. No acute bony abnormality. IMPRESSION: Chronic left pleural effusion with left basilar atelectasis or infiltrate. Small right pleural effusion with right base atelectasis. Electronically Signed   By: Charlett NoseKevin  Dover M.D.   On: 11/19/2015 18:23    EKG:  Orders placed or performed during the hospital encounter of 11/19/15  . EKG 12-Lead  . EKG 12-Lead    ASSESSMENT AND PLAN:  Principal Problem:   Pneumonia Active Problems:   COPD exacerbation (HCC) 1. COPD exacerbation due to pneumonia, continue steroids, DuoNeb as follow clinically 2. Bilateral basilar pneumonia, get sputum cultures if possible. Continue patient on the  Rocephin and Zithromax, Although aspiration pneumonitis is a possibility 3. Suspected stroke with left-sided weakness, Continue patient on aspirin therapy as well as Lipitor  , Plavix , getting brain MRI to rule out stroke  4. Slurring speech, concerning for dysphagia , getting speech therapist evaluation , downgrade diet depending on patient's needs  5. Tobacco abuse. Continue nicotine replacement therapy    Management plans discussed with the patient, family and they are in agreement.   DRUG ALLERGIES: No Known Allergies  CODE STATUS:     Code Status Orders        Start     Ordered   11/19/15 2030  Do not attempt resuscitation (DNR)   Continuous    Question Answer Comment  In the event of cardiac or respiratory ARREST Do not call a "code blue"   In the event of cardiac or respiratory ARREST Do not perform Intubation, CPR, defibrillation or ACLS   In the event of cardiac or respiratory ARREST Use medication by any route, position, wound care, and other measures to relive pain and suffering. May use oxygen, suction and manual treatment of airway obstruction as needed for comfort.      11/19/15 2029      TOTAL TIME TAKING CARE OF THIS PATIENT: 40 minutes.    Katharina CaperVAICKUTE,Reshunda Strider M.D on 11/20/2015 at 1:50 PM  Between 7am to 6pm - Pager - [phone]  After 6pm go to [email or web address] - password [email or web address] [facility]  [facility]  Office  [phone]  CC: Primary care physician; No PCP Per Patient

## 2015-11-20 NOTE — Progress Notes (Signed)
rn spoke with dr Nemiah Commanderkalisetti. fsbs 407. Will have stat glucose done. Pt on regular novolog ssi. rn spoke with dr Nemiah Commanderkalisetti . md orders 16 units novolog stat and chg to resistant novolog ssi with hs coverage

## 2015-11-21 LAB — GLUCOSE, CAPILLARY
GLUCOSE-CAPILLARY: 319 mg/dL — AB (ref 65–99)
GLUCOSE-CAPILLARY: 332 mg/dL — AB (ref 65–99)
GLUCOSE-CAPILLARY: 409 mg/dL — AB (ref 65–99)
GLUCOSE-CAPILLARY: 417 mg/dL — AB (ref 65–99)
GLUCOSE-CAPILLARY: 556 mg/dL — AB (ref 65–99)
Glucose-Capillary: 418 mg/dL — ABNORMAL HIGH (ref 65–99)
Glucose-Capillary: 505 mg/dL — ABNORMAL HIGH (ref 65–99)

## 2015-11-21 LAB — CREATININE, SERUM
CREATININE: 0.66 mg/dL (ref 0.61–1.24)
GFR calc Af Amer: >60 mL/min (ref >60)

## 2015-11-21 LAB — GLUCOSE, RANDOM: GLUCOSE: 491 mg/dL — AB (ref 65–99)

## 2015-11-21 MED ORDER — FUROSEMIDE 10 MG/ML IJ SOLN
40.0000 mg | Freq: Two times a day (BID) | INTRAMUSCULAR | Status: DC
  Administered 2015-11-21 – 2015-11-25 (×8): 40 mg via INTRAVENOUS
  Filled 2015-11-21 (×8): qty 4

## 2015-11-21 MED ORDER — INSULIN REGULAR HUMAN 100 UNIT/ML IJ SOLN: 20.0000 [IU] | Freq: Once | INTRAMUSCULAR | Status: DC

## 2015-11-21 MED ORDER — NITROGLYCERIN 2 % TD OINT
0.5000 [in_us] | TOPICAL_OINTMENT | Freq: Four times a day (QID) | TRANSDERMAL | Status: DC
  Administered 2015-11-21 – 2015-11-25 (×15): 0.5 [in_us] via TOPICAL
  Filled 2015-11-21 (×15): qty 1

## 2015-11-21 MED ORDER — INSULIN ASPART 100 UNIT/ML ~~LOC~~ SOLN
30.0000 [IU] | Freq: Once | SUBCUTANEOUS | Status: AC
  Administered 2015-11-21: 30 [IU] via SUBCUTANEOUS
  Filled 2015-11-21: qty 30

## 2015-11-21 MED ORDER — AZITHROMYCIN 250 MG PO TABS
500.0000 mg | ORAL_TABLET | Freq: Every day | ORAL | Status: DC
  Administered 2015-11-21 – 2015-11-23 (×3): 500 mg via ORAL
  Filled 2015-11-21 (×3): qty 2

## 2015-11-21 MED ORDER — INSULIN ASPART 100 UNIT/ML ~~LOC~~ SOLN
0.0000 [IU] | Freq: Three times a day (TID) | SUBCUTANEOUS | Status: DC
  Administered 2015-11-22: 7 [IU] via SUBCUTANEOUS
  Administered 2015-11-22: 4 [IU] via SUBCUTANEOUS
  Administered 2015-11-22: 15 [IU] via SUBCUTANEOUS
  Administered 2015-11-23: 7 [IU] via SUBCUTANEOUS
  Administered 2015-11-23: 4 [IU] via SUBCUTANEOUS
  Administered 2015-11-24: 15 [IU] via SUBCUTANEOUS
  Administered 2015-11-24: 11 [IU] via SUBCUTANEOUS
  Administered 2015-11-24: 4 [IU] via SUBCUTANEOUS
  Administered 2015-11-25: 7 [IU] via SUBCUTANEOUS
  Administered 2015-11-25: 4 [IU] via SUBCUTANEOUS
  Filled 2015-11-21: qty 20
  Filled 2015-11-21 (×2): qty 4
  Filled 2015-11-21: qty 15
  Filled 2015-11-21: qty 7
  Filled 2015-11-21: qty 11
  Filled 2015-11-21 (×2): qty 4
  Filled 2015-11-21 (×2): qty 7
  Filled 2015-11-21: qty 15

## 2015-11-21 MED ORDER — LISINOPRIL 5 MG PO TABS
5.0000 mg | ORAL_TABLET | Freq: Every day | ORAL | Status: DC
  Administered 2015-11-21 – 2015-11-22 (×2): 5 mg via ORAL
  Filled 2015-11-21 (×2): qty 1

## 2015-11-21 MED ORDER — INSULIN ASPART 100 UNIT/ML ~~LOC~~ SOLN
20.0000 [IU] | Freq: Once | SUBCUTANEOUS | Status: AC
  Administered 2015-11-21: 20 [IU] via SUBCUTANEOUS

## 2015-11-21 MED ORDER — LISINOPRIL 5 MG PO TABS: 5.0000 mg | ORAL_TABLET | Freq: Every day | ORAL | Status: DC

## 2015-11-21 MED ORDER — INSULIN ASPART 100 UNIT/ML ~~LOC~~ SOLN
0.0000 [IU] | Freq: Every day | SUBCUTANEOUS | Status: DC
  Administered 2015-11-21: 5 [IU] via SUBCUTANEOUS
  Administered 2015-11-23: 3 [IU] via SUBCUTANEOUS
  Administered 2015-11-24: 5 [IU] via SUBCUTANEOUS
  Filled 2015-11-21: qty 5
  Filled 2015-11-21: qty 2
  Filled 2015-11-21: qty 5

## 2015-11-21 MED ORDER — INSULIN GLARGINE 100 UNIT/ML ~~LOC~~ SOLN
25.0000 [IU] | Freq: Every day | SUBCUTANEOUS | Status: DC
Start: 2015-11-21 — End: 2015-11-25
  Administered 2015-11-21 – 2015-11-24 (×4): 25 [IU] via SUBCUTANEOUS
  Filled 2015-11-21 (×5): qty 0.25

## 2015-11-21 MED ORDER — INSULIN ASPART 100 UNIT/ML ~~LOC~~ SOLN
25.0000 [IU] | Freq: Once | SUBCUTANEOUS | Status: AC
  Administered 2015-11-21: 25 [IU] via SUBCUTANEOUS
  Filled 2015-11-21: qty 25

## 2015-11-21 NOTE — Evaluation (Signed)
Physical Therapy Evaluation Patient Details Name: Mario PenceMichael Peterson MRN: [id number] DOB: 09-26-1951 Today's Date: 11/21/2015   History of Present Illness  64 yo male with onset of PNA and diuresing, has leukocytosis, chest pain and L side weakness.  PMHx:  DM, MI, COPD, PN  Clinical Impression  Pt was able to walk with 92% O2 sat on room air and pregait was 92%.  His expectation is that he will be leaving the area in the next couple weeks, but may stay longer.  He is appropriate for short stint with HHPT and then with 24/7 assistance with family.  Pt in agreement with therapy goals and therefore will proceed recommendations and will keep pt on therapy at hospital.    Follow Up Recommendations Home health PT;Supervision/Assistance - 24 hour    Equipment Recommendations  Rolling walker with 5" wheels    Recommendations for Other Services       Precautions / Restrictions Precautions Precautions: Fall Precaution Comments: Needs AD Restrictions Weight Bearing Restrictions: No      Mobility  Bed Mobility Overal bed mobility: Needs Assistance Bed Mobility: Supine to Sit     Supine to sit: Min assist     General bed mobility comments: Pt needed help to suppport trunk and to finish scooting out to EOB  Transfers Overall transfer level: Modified independent Equipment used: Rolling walker (2 wheeled);1 person hand held assist             General transfer comment: reminders for safety and controlling balance  Ambulation/Gait Ambulation/Gait assistance: Min guard Ambulation Distance (Feet): 120 Feet Assistive device: Rolling walker (2 wheeled);1 person hand held assist Gait Pattern/deviations: Step-through pattern;Decreased stride length;Wide base of support (SOB with first 60') Gait velocity: controlled Gait velocity interpretation: Below normal speed for age/gender    Stairs            Wheelchair Mobility    Modified Rankin (Stroke Patients Only)        Balance Overall balance assessment: Needs assistance Sitting-balance support: Feet supported Sitting balance-Leahy Scale: Good   Postural control: Posterior lean Standing balance support: Bilateral upper extremity supported Standing balance-Leahy Scale: Fair                               Pertinent Vitals/Pain Pain Assessment: No/denies pain    Home Living Family/patient expects to be discharged to:: Skilled nursing facility Living Arrangements: Alone;Other (Comment) (has been going to stay with [place] ex wife [place]               Additional Comments: [place]    Prior Function Level of Independence: Needs assistance   Gait / Transfers Assistance Needed: PN and cannot walk more than short trips  ADL's / Homemaking Assistance Needed: ex wife is doing housework        Higher education careers adviserHand Dominance        Extremity/Trunk Assessment   Upper Extremity Assessment: Overall WFL for tasks assessed           Lower Extremity Assessment: Generalized weakness      Cervical / Trunk Assessment: Normal  Communication   Communication: No difficulties  Cognition Arousal/Alertness: Awake/alert Behavior During Therapy: WFL for tasks assessed/performed Overall Cognitive Status: Within Functional Limits for tasks assessed                      General Comments General comments (  skin integrity, edema, etc.): Pt is getting up to walk with PT and has impulsive quality to gait, reminded pt to control speed and stay close to walker.  With no AD is listing a little to L side.    Exercises        Assessment/Plan    PT Assessment Patient needs continued PT services  PT Diagnosis Generalized weakness   PT Problem List Decreased strength;Decreased range of motion;Decreased activity tolerance;Decreased balance;Decreased mobility;Decreased coordination;Decreased knowledge of use of DME;Decreased safety awareness;Decreased knowledge of  precautions;Cardiopulmonary status limiting activity;Decreased skin integrity  PT Treatment Interventions DME instruction;Stair training;Gait training;Functional mobility training;Therapeutic activities;Therapeutic exercise;Balance training;Neuromuscular re-education;Patient/family education   PT Goals (Current goals can be found in the Care Plan section) Acute Rehab PT Goals Patient Stated Goal: to get home  PT Goal Formulation: With patient Time For Goal Achievement: 12/05/15 Potential to Achieve Goals: Good    Frequency Min 2X/week   Barriers to discharge Inaccessible home environment;Decreased caregiver support home alone and needs to climb stairs to enter house    Co-evaluation               End of Session Equipment Utilized During Treatment: Gait belt Activity Tolerance: Patient tolerated treatment well Patient left: in bed;with call bell/phone within reach Nurse Communication: Mobility status         Time: 1741-1815 PT Time Calculation (min) (ACUTE ONLY): 34 min   Charges:   PT Evaluation $Initial PT Evaluation Tier I: 1 Procedure PT Treatments $Gait Training: 8-22 mins   PT G CodesIvar Drape 12/04/2015, 6:29 PM   Samul Dada, PT MS Acute Rehab Dept. Number: [phone] [id number] and [facility] [phone]

## 2015-11-21 NOTE — Progress Notes (Signed)
[facility] - [place] at [facility]   PATIENT NAME: Mario Peterson    MR#:  [id number]  DATE OF BIRTH:  1951-01-14  SUBJECTIVE:  CHIEF COMPLAINT:   Chief Complaint  Patient presents with  . Shortness of Breath   patient is 64 year old Caucasian male with past history significant for history of COPD, diabetes, hypertension, coronary artery disease who presents to the hospital with complaints of cough, shortness of breath, wheezing, fevers, chills and left-sided weakness, also left facial droop. He was admitted to the hospital for further evaluation and treatment. Chest x-ray  revealed pleural effusions and lower atelectasis bilaterally. CT of head was unremarkable. Brain MRI was also unremarkable, but chronic left temporal lobe infarct. Patient is uncomfortable today. Still complains of cough and shortness of breath, does not feel that he has improved significantly.   Review of Systems  Constitutional: Positive for malaise/fatigue. Negative for fever, chills and weight loss.  HENT: Negative for congestion.   Eyes: Negative for blurred vision and double vision.  Respiratory: Positive for cough, sputum production, shortness of breath and wheezing.   Cardiovascular: Negative for chest pain, palpitations, orthopnea, leg swelling and PND.  Gastrointestinal: Negative for nausea, vomiting, abdominal pain, diarrhea, constipation and blood in stool.  Genitourinary: Negative for dysuria, urgency, frequency and hematuria.  Musculoskeletal: Negative for falls.  Neurological: Positive for focal weakness. Negative for dizziness, tremors and headaches.  Endo/Heme/Allergies: Does not bruise/bleed easily.  Psychiatric/Behavioral: Negative for depression. The patient does not have insomnia.     VITAL SIGNS: Blood pressure 157/60, pulse 69, temperature 97.5 F (36.4 C), temperature source Oral, resp. rate 18, height  (1.727 m), weight 84.324 kg (185 lb 14.4 oz), SpO2 91  %.  PHYSICAL EXAMINATION:   GENERAL:  64 y.o.-year-old patient lying in the bed with no acute distress. Left facial droop. Slurring speech EYES: Pupils equal, round, reactive to light and accommodation. No scleral icterus. Extraocular muscles intact.  HEENT: Head atraumatic, normocephalic. Oropharynx and nasopharynx clear.  NECK:  Supple, no jugular venous distention. No thyroid enlargement, no tenderness.  LUNGS: Mildly diminished breath sounds bilaterally, intermittent wheezing, few rales,rhonchi or crepitation noted diffusely on auscultation.  Intermittently using accessory muscles of respiration.  CARDIOVASCULAR: S1, S2 normal. No murmurs, rubs, or gallops.  ABDOMEN: Soft, nontender, nondistended. Bowel sounds present. No organomegaly or mass.  EXTREMITIES: No pedal edema, cyanosis, or clubbing.  NEUROLOGIC: Cranial nerves left facial droop. Muscle strength 4/5 in left-sided extremities. Sensation intact. Gait not checked.  PSYCHIATRIC: The patient is alert and oriented x 3.  SKIN: No obvious rash, lesion, or ulcer.   ORDERS/RESULTS REVIEWED:   CBC  Recent Labs Lab 11/19/15 1740  WBC 13.2*  HGB 11.5*  HCT 36.2*  PLT 223  MCV 74.9*  MCH 23.7*  MCHC 31.6*  RDW 19.3*  LYMPHSABS 1.5  MONOABS 0.8  EOSABS 0.3  BASOSABS 0.1   ------------------------------------------------------------------------------------------------------------------  Chemistries   Recent Labs Lab 11/19/15 1740 11/20/15 1813 11/21/15 0522  NA 141  --   --   K 4.7  --   --   CL 101  --   --   CO2 32  --   --   GLUCOSE 128* 458*  --   BUN 20  --   --   CREATININE 0.81  --  0.66  CALCIUM 9.4  --   --   AST 14*  --   --   ALT 10*  --   --   ALKPHOS 59  --   --  BILITOT 0.5  --   --    ------------------------------------------------------------------------------------------------------------------ estimated creatinine clearance is 96.1 mL/min (by C-G formula based on Cr of  0.66). ------------------------------------------------------------------------------------------------------------------ No results for input(s): TSH, T4TOTAL, T3FREE, THYROIDAB in the last 72 hours.  Invalid input(s): FREET3  Cardiac Enzymes  Recent Labs Lab 11/19/15 2223 11/20/15 0155 11/20/15 0815  TROPONINI <0.03 <0.03 <0.03   ------------------------------------------------------------------------------------------------------------------ Invalid input(s): POCBNP ---------------------------------------------------------------------------------------------------------------  RADIOLOGY: Ct Head Wo Contrast  11/19/2015  CLINICAL DATA:  Left-sided weakness for 2 weeks with left-sided facial droop EXAM: CT HEAD WITHOUT CONTRAST TECHNIQUE: Contiguous axial images were obtained from the base of the skull through the vertex without intravenous contrast. COMPARISON:  None. FINDINGS: Bony structures are within normal limits. Some mild motion artifact is noted. Diffuse atrophic changes are seen. Mild chronic white matter ischemic changes noted. No findings to suggest acute hemorrhage, acute infarction or space-occupying mass lesion are noted. IMPRESSION: Chronic atrophic and ischemic changes without acute abnormality. Electronically Signed   By: Alcide Clever M.D.   On: 11/19/2015 17:55   Mr Brain Wo Contrast  11/20/2015  CLINICAL DATA:  Left arm and leg weakness and left facial droop for 2 weeks. EXAM: MRI HEAD WITHOUT CONTRAST TECHNIQUE: Multiplanar, multiecho pulse sequences of the brain and surrounding structures were obtained without intravenous contrast. COMPARISON:  Head CT 11/19/2015 FINDINGS: There is no evidence of acute infarct, intracranial hemorrhage, mass, midline shift, or extra-axial fluid collection. There is moderate cerebral atrophy, advanced for age. A small, chronic posterior left temporal lobe transcortical infarct is noted. T2 hyperintensities in the periventricular and  subcortical cerebral white matter bilaterally are nonspecific but compatible with mild chronic small vessel ischemic disease. Orbits are unremarkable. Mild mucosal thickening is noted throughout the paranasal sinuses. There is a trace right mastoid effusion. Major intracranial vascular flow voids are preserved. IMPRESSION: 1. No acute intracranial abnormality. 2. Chronic left temporal lobe infarct. 3. Mild chronic small vessel ischemic disease. 4. Age advanced cerebral atrophy. Electronically Signed   By: Sebastian Ache M.D.   On: 11/20/2015 13:53   Dg Chest Port 1 View  11/19/2015  CLINICAL DATA:  Shortness of breath, cough EXAM: PORTABLE CHEST 1 VIEW COMPARISON:  07/06/2015 FINDINGS: Prior CABG. Cardiomegaly. Left lower lobe atelectasis or infiltrate with left effusion, similar to prior study. Small right effusion with right base atelectasis. No overt edema/failure. No acute bony abnormality. IMPRESSION: Chronic left pleural effusion with left basilar atelectasis or infiltrate. Small right pleural effusion with right base atelectasis. Electronically Signed   By: Charlett Nose M.D.   On: 11/19/2015 18:23    EKG:  Orders placed or performed during the hospital encounter of 11/19/15  . EKG 12-Lead  . EKG 12-Lead    ASSESSMENT AND PLAN:  Principal Problem:   Pneumonia Active Problems:   COPD exacerbation (HCC) 1. Acute respiratory failure with hypoxia due to COPD exacerbation due to pneumonia, continue oxygen therapy , wean to room air as tolerated  2. COPD exacerbation due to pneumonia , continue steroids, DuoNeb and follow clinically, no significant improvement as of today. In fact, it sounds more like fluid overload 3. Bilateral basilar pneumonia, get sputum cultures if possible. Discontinue  Rocephin , change Zithromax to oral route, Aspiration pneumonitis is still a possibility, SLP did not recommend no diet adjustment 3. left-sided weakness, reports no stroke on MRI, although old left-sided  stroke was noted  Continue patient on aspirin therapy as well as Lipitor  , Plavix ,  supportive therapy 4. Slurring speech, concerning  for dysphagia awaiting for speech  therapist evaluation , downgrade diet depending on patient's needs  5. Tobacco abuse. Continue nicotine replacement therapy 6. Acute pulmonary edema due to CHF, getting echocardiogram, initiate patient on Lasix, follow clinically , Add nitroglycerin topically to facilitate diuresis.  7. Essential hypertension, poorly controlled, she is on bisoprolol as well as metoprolol, add lisinopril   Management plans discussed with the patient, family and they are in agreement.   DRUG ALLERGIES: No Known Allergies  CODE STATUS:     Code Status Orders        Start     Ordered   11/19/15 2030  Do not attempt resuscitation (DNR)   Continuous    Question Answer Comment  In the event of cardiac or respiratory ARREST Do not call a "code blue"   In the event of cardiac or respiratory ARREST Do not perform Intubation, CPR, defibrillation or ACLS   In the event of cardiac or respiratory ARREST Use medication by any route, position, wound care, and other measures to relive pain and suffering. May use oxygen, suction and manual treatment of airway obstruction as needed for comfort.      11/19/15 2029      TOTAL TIME TAKING CARE OF THIS PATIENT: 40 minutes.    Katharina CaperVAICKUTE,Rudean Icenhour M.D on 11/21/2015 at 1:33 PM  Between 7am to 6pm - Pager - [phone]  After 6pm go to [email or web address] - password [email or web address] [facility]  [facility]  Office  [phone]  CC: Primary care physician; No PCP Per Patient

## 2015-11-21 NOTE — Progress Notes (Signed)
Dr. Patrina LeveringVacikute called for blood sugar of 419.  Telephone orders received.

## 2015-11-21 NOTE — Progress Notes (Signed)
Speech Language Pathology Dysphagia Treatment Patient Details Name: Mario PenceMichael Steinhardt MRN: [id number] DOB: [birth year] Today's Date: 11/21/2015 Time: [id number] SLP Time Calculation (min) (ACUTE ONLY): 19 min  Assessment / Plan / Recommendation Clinical Impression   pt presented with a mild oral dysphagia characterized by slight lengthy mastication and bolus formation. Pt with no overt ssx aspiration noted during intake. Pt stated he has had no difficulty with meals and does not have any sense of choking during intake. Nursing stated no concerns. Pt recommended to continue current diet.     Diet Recommendation    Reg with thin   SLP Plan Continue with current plan of care   Pertinent Vitals/Pain No pain reported    Swallowing Goals     General Behavior/Cognition: Alert;Cooperative;Pleasant mood Patient Positioning: Upright in bed Oral care provided: N/A HPI: Mario PenceMichael Goetting is a 64 y.o. male with a known history of hypertension, diabetes, COPD and MI. The patient has had the shortness of breath, cough sputum and wheezing for the past 2 days. He also complains of fever and chills. Symptoms has been worsening today. He also complains of chest pain which is on the left side, dull, intermittent without radiation. He also complains of left side facial droop 2 weeks ago and slight weakness in his left arm and leg for 2 weeks. His WBC is 13,000, chest x-ray showed left pleural effusion with left atelectasis or infiltrate. Troponin level is normal.. Pt denies any swallowing problems. Pt did state that he takes his meds with puree because he has to take so many and [place] he take them in puree.   Oral Cavity - Oral Hygiene     Dysphagia Treatment Treatment Methods: Skilled observation;Patient/caregiver education Patient observed directly with PO's: Yes Type of PO's observed: Regular Feeding: Able to feed self Liquids provided via: Straw;Cup Oral Phase Signs  & Symptoms: Prolonged bolus formation Amount of cueing: Independent   GO     Joycelyn RuaStacie Harris Sauber 11/21/2015, 12:38 PM

## 2015-11-21 NOTE — Progress Notes (Signed)
Inpatient Diabetes Program Recommendations  AACE/ADA: New Consensus Statement on Inpatient Glycemic Control (2015)  Target Ranges:  Prepandial:   less than 140 mg/dL      Peak postprandial:   less than 180 mg/dL (1-2 hours)      Critically ill patients:  140 - 180 mg/dL   Review of Glycemic Control  Results for Mario Peterson, Mario Peterson (MRN [id number]) as of 11/21/2015 08:56  Ref. Range 11/20/2015 20:37 11/20/2015 22:03 11/20/2015 22:04 11/21/2015 00:27 11/21/2015 07:26  Glucose-Capillary Latest Ref Range: 65-99 mg/dL 409496 (H) 811461 (H) 914458 (H) 319 (H) 332 (H)    Diabetes history: Type 2 Outpatient Diabetes medications: Lantus 20 units qday, Metformin 500mg  bid, Januvia 100mg  qday Current orders for Inpatient glycemic control:Lantus 20 units qday, Novolog 0-20 units tid, Novolog 0-5 units qhs * steroids q6h  Inpatient Diabetes Program Recommendations: while patient is on steroids, consider increasing Lantus to 25 units qhs and adding Novolog 10 units tid with meals  (in addition to the correction insulin already ordered)  MD paged at 9:01am to notify her of recommendations  Susette RacerJulie Avagrace Botelho, RN, [place], [place]HA, CDE Diabetes Coordinator Inpatient Diabetes Program  [phone] (Team Pager) [phone] [facility] Office) 11/21/2015 9:01 AM

## 2015-11-21 NOTE — Clinical Documentation Improvement (Signed)
Internal Medicine  Can the diagnosis of Hypoxia be further specified? Please document findings in next progress note; Not in BPA drop down box.  Thank you!   Other  Clinically Undetermined  Supporting Information:  Tachycardic with HR > 90  Tachypneic with RR ranging from 18 to 35  Desats to 82% in room air  Placed in 2L FiO2 with improvement of oxygen saturations to 91, 97%  Please exercise your independent, professional judgment when responding. A specific answer is not anticipated or expected.  Thank You, Shellee MiloEileen T Rommel Hogston RN, BSN, CCDS Health Information Management [place] [phone]; Cell: [phone]

## 2015-11-21 NOTE — Care Management Important Message (Signed)
Important Message  Patient Details  Name: Mario Peterson MRN: [id number] Date of Birth: 02-08-1951   Medicare Important Message Given:  Yes    Danyell Awbrey A, RN 11/21/2015, 8:55 AM

## 2015-11-21 NOTE — Plan of Care (Signed)
Took over pt at 3:30.  Blood sugar 409.  Dr. Jeanene Erballed.  Was told to give 30 u and recheck in 2 hours.  Solumedrol was stopped.  Pt had no c/o pain and remained safe.  Wked w/PT - was told he got very SOB and HR increased.

## 2015-11-21 NOTE — Care Management Note (Signed)
Case Management Note  Patient Details  Name: Jyquan Kenley MRN: [id number] Date of Birth: 12-Jan-1951  Subjective/Objective:    RNCM assessment for discharge planning. Admitted with PNA and COPD exacerbation. Met with patient and wife at bedside. Prior to admission, patient lived at home with his wife. He required no DME or home O2. PT evaluation pending. Wife expressed that she would like to seek STR for patient if possible. She has met with CSW to discuss this further.  PCP is at [facility]            Action/Plan: Provided a list of home health providers if home health needed. Will follow progression.   Expected Discharge Date:                  Expected Discharge Plan:  [place]  In-House Referral:  Clinical Social Work  Discharge planning Services  CM Consult  Post Acute Care Choice:  Home Health Choice offered to:  Spouse, Patient  DME Arranged:    DME Agency:     HH Arranged:    [place] Agency:     Status of Service:  In process, will continue to follow  Medicare Important Message Given:  Yes Date Medicare IM Given:    Medicare IM give by:    Date Additional Medicare IM Given:    Additional Medicare Important Message give by:     If discussed at [place] of Stay Meetings, dates discussed:    Additional Comments:  Jolly Mango, RN 11/21/2015, 12:14 PM

## 2015-11-21 NOTE — Progress Notes (Signed)
Lab attempted to draw potassium level x 2.  Patient refused.  Dr. Winona LegatoVaickute called and notified.  Order obtained to d/c lab draw.

## 2015-11-21 NOTE — Progress Notes (Signed)
Complete CSW assessment to follow.  Mario Peterson. LCSWA Clinical Social Work Department [phone] 3:58 PM

## 2015-11-21 NOTE — Plan of Care (Signed)
On glucose recheck - pt's blood sugar was 505 - Called prime doc - Dr. Luberta MutterKonidena.  Order to give 25 units of novolog and give lantus at 8 pm instead of 10.  Wants blood draw for glucose. Pt had been eating oreos.  Instructed him to not eat them anymore.

## 2015-11-22 ENCOUNTER — Inpatient Hospital Stay
Admit: 2015-11-22 | Discharge: 2015-11-22 | Disposition: A | Discharge status: Home / Self Care | Payer: Medicare Other | Attending: Internal Medicine | Admitting: Internal Medicine

## 2015-11-22 ENCOUNTER — Inpatient Hospital Stay: Payer: Medicare Other

## 2015-11-22 ENCOUNTER — Encounter: Payer: Self-pay | Admitting: Radiology

## 2015-11-22 LAB — CBC
HCT: 29.4 % — ABNORMAL LOW (ref 40.0–52.0)
Hemoglobin: 9.4 g/dL — ABNORMAL LOW (ref 13.0–18.0)
MCH: 23.8 pg — ABNORMAL LOW (ref 26.0–34.0)
MCHC: 32.1 g/dL (ref 32.0–36.0)
MCV: 74.4 fL — AB (ref 80.0–100.0)
PLATELETS: 189 10*3/uL (ref 150–440)
RBC: 3.95 MIL/uL — AB (ref 4.40–5.90)
RDW: 18.8 % — AB (ref 11.5–14.5)
WBC: 15.1 10*3/uL — AB (ref 3.8–10.6)

## 2015-11-22 LAB — GLUCOSE, CAPILLARY
GLUCOSE-CAPILLARY: 248 mg/dL — AB (ref 65–99)
GLUCOSE-CAPILLARY: 315 mg/dL — AB (ref 65–99)
Glucose-Capillary: 367 mg/dL — ABNORMAL HIGH (ref 65–99)
Glucose-Capillary: 198 mg/dL — ABNORMAL HIGH (ref 65–99)
Glucose-Capillary: 179 mg/dL — ABNORMAL HIGH (ref 65–99)

## 2015-11-22 LAB — CREATININE, SERUM
CREATININE: 0.74 mg/dL (ref 0.61–1.24)
GFR calc Af Amer: >60 mL/min (ref >60)

## 2015-11-22 MED ORDER — LISINOPRIL 5 MG PO TABS
5.0000 mg | ORAL_TABLET | Freq: Two times a day (BID) | ORAL | Status: DC
  Administered 2015-11-22 – 2015-11-24 (×4): 5 mg via ORAL
  Filled 2015-11-22 (×4): qty 1

## 2015-11-22 MED ORDER — IOHEXOL 350 MG/ML SOLN
100.0000 mL | Freq: Once | INTRAVENOUS | Status: AC | PRN
  Administered 2015-11-22: 100 mL via INTRAVENOUS

## 2015-11-22 NOTE — Clinical Social Work Note (Signed)
Clinical Social Work Assessment  Patient Details  Name: Mario PenceMichael Millstein MRN: [id number] Date of Birth: Aug 21, 1951  Date of referral:  11/22/15               Reason for consult:  Facility Placement, [facility]                Permission sought to share information with:  Family Supports Permission granted to share information::  Yes, Verbal Permission Granted  Name::      (wife Gardiner RamusLillian [phone]  Agency::     Relationship::     Contact Information:     Housing/Transportation Living arrangements for the past 2 months:  Assisted Living Facility, Single Family Home Source of Information:  Patient, Spouse Patient Interpreter Needed:  None Criminal Activity/Legal Involvement Pertinent to Current Situation/Hospitalization:    Significant Relationships:  Adult Children, Spouse Lives with:  Spouse Do you feel safe going back to the place where you live?  Yes Need for family participation in patient care:  Yes (Comment) (patient wants wife involved in care)  Care giving concerns:  Wife is concerned that she is unable to care for patient at home.   Social Worker assessment / plan:  Visual merchandiserClinical Social Worker (CSW) consult for information on possible placement, PT is recommending home health PT to assist with getting patient back to previous level of functioning.   Patient was alert, oriented and engaged in conversation with CSW.  (Additional Information provided by patient's wife Gardiner RamusLillian at bedside).  CSW introduced self and explained role of CSW department.   Patient currently lives with his wife, dau and grandchildren for the past two weeks .  Prior to living with [place] and [place] ALF for about two months. CSW explained that if  PT is recommends rehab CSW would be able to assist with SNF however for ALF she would need to start that process outside of the hospital.    CSW provided patient and wife with a list of Assistant living facilities, and  information to contact Elder Care to assist them with the process.  Employment status:  Disabled (Comment on whether or not currently receiving Disability) Insurance information:  [place], [place] Recommendations:  Home with Home Health Information / Referral to community resources:  Other (Comment Required) (community resources for ALF and supportive services)  Patient/Family's Response to care:  Patient and wife was appreciative of information provided by CSW.  Patient/Family's Understanding of and Emotional Response to Diagnosis, Current Treatment, and Prognosis:  Patient understands that he is under continued medical work up at this time.  Once medically stable he  will discharge home with home health and work on locating an ALF.  Emotional Assessment Appearance:  Appears stated age Attitude/Demeanor/Rapport:    Affect (typically observed):  Accepting, Adaptable, Calm, Pleasant Orientation:  Oriented to Self, Oriented to Place, Oriented to  Time, Oriented to Situation Alcohol / Substance use:    Psych involvement (Current and /or in the community):  No (Comment)  Discharge Needs  Concerns to be addressed:  Care Coordination, Discharge Planning Concerns Readmission within the last 30 days:  No Current discharge risk:  Chronically ill Barriers to Discharge:  Continued Medical Work up   [employer] ServiceMaster CompanyMoore, Aundraya Dripps H, LCSW 11/22/2015, 8:02 AM

## 2015-11-22 NOTE — Progress Notes (Signed)
[facility] [place] at [facility]   PATIENT NAME: Mario PenceMichael Peterson    MR#:  [id number]  DATE OF BIRTH:  1951-10-20  SUBJECTIVE:  CHIEF COMPLAINT:   Chief Complaint  Patient presents with  . Shortness of Breath   patient is 64 year old Caucasian male with past history significant for history of COPD, diabetes, hypertension, coronary artery disease who presents to the hospital with complaints of cough, shortness of breath, wheezing, fevers, chills and left-sided weakness, also left facial droop. He was admitted to the hospital for further evaluation and treatment. Chest x-ray  revealed pleural effusions and lower atelectasis bilaterally. CT of head was unremarkable. Brain MRI was also unremarkable, but chronic left temporal lobe infarct. Patient is comfortable today, but very sleepy and not able to provide review of systems  Review of Systems  Constitutional: Positive for malaise/fatigue. Negative for fever, chills and weight loss.  HENT: Negative for congestion.   Eyes: Negative for blurred vision and double vision.  Respiratory: Positive for cough, sputum production, shortness of breath and wheezing.   Cardiovascular: Negative for chest pain, palpitations, orthopnea, leg swelling and PND.  Gastrointestinal: Negative for nausea, vomiting, abdominal pain, diarrhea, constipation and blood in stool.  Genitourinary: Negative for dysuria, urgency, frequency and hematuria.  Musculoskeletal: Negative for falls.  Neurological: Positive for focal weakness. Negative for dizziness, tremors and headaches.  Endo/Heme/Allergies: Does not bruise/bleed easily.  Psychiatric/Behavioral: Negative for depression. The patient does not have insomnia.     VITAL SIGNS: Blood pressure 140/56, pulse 57, temperature 97.3 F (36.3 C), temperature source Axillary, resp. rate 18, height 5\' 8"  (1.727 m), weight 84.324 kg (185 lb 14.4 oz), SpO2 95 %.  PHYSICAL EXAMINATION:   GENERAL:  64  y.o.-year-old patient lying in the bed with no acute distress. Left facial droop. Slurring speech EYES: Pupils equal, round, reactive to light and accommodation. No scleral icterus. Extraocular muscles intact.  HEENT: Head atraumatic, normocephalic. Oropharynx and nasopharynx clear.  NECK:  Supple, no jugular venous distention. No thyroid enlargement, no tenderness.  LUNGS: Some diminished breath sounds bilaterally, no wheezing, rales,rhonchi or crepitation noted anteriorly  , not using accessory muscles of respiration.  CARDIOVASCULAR: S1, S2 normal. No murmurs, rubs, or gallops.  ABDOMEN: Soft, nontender, nondistended. Bowel sounds present. No organomegaly or mass.  EXTREMITIES: No pedal edema, cyanosis, or clubbing.  NEUROLOGIC: Cranial nerves left facial droop. Muscle strength 4/5 in left-sided extremities. Sensation intact. Gait not checked.  PSYCHIATRIC: The patient is somnolent , sleepy , not able to assess orientation .  SKIN: No obvious rash, lesion, or ulcer.   ORDERS/RESULTS REVIEWED:   CBC  Recent Labs Lab 11/19/15 1740 11/22/15 0305  WBC 13.2* 15.1*  HGB 11.5* 9.4*  HCT 36.2* 29.4*  PLT 223 189  MCV 74.9* 74.4*  MCH 23.7* 23.8*  MCHC 31.6* 32.1  RDW 19.3* 18.8*  LYMPHSABS 1.5  --   MONOABS 0.8  --   EOSABS 0.3  --   BASOSABS 0.1  --    ------------------------------------------------------------------------------------------------------------------  Chemistries   Recent Labs Lab 11/19/15 1740 11/20/15 1813 11/21/15 0522 11/21/15 2019 11/22/15 0305  NA 141  --   --   --   --   K 4.7  --   --   --   --   CL 101  --   --   --   --   CO2 32  --   --   --   --   GLUCOSE 128* 458*  --  491*  --   BUN 20  --   --   --   --   CREATININE 0.81  --  0.66  --  0.74  CALCIUM 9.4  --   --   --   --   AST 14*  --   --   --   --   ALT 10*  --   --   --   --   ALKPHOS 59  --   --   --   --   BILITOT 0.5  --   --   --   --     ------------------------------------------------------------------------------------------------------------------ estimated creatinine clearance is 96.1 mL/min (by C-G formula based on Cr of 0.74). ------------------------------------------------------------------------------------------------------------------ No results for input(s): TSH, T4TOTAL, T3FREE, THYROIDAB in the last 72 hours.  Invalid input(s): FREET3  Cardiac Enzymes  Recent Labs Lab 11/19/15 2223 11/20/15 0155 11/20/15 0815  TROPONINI <0.03 <0.03 <0.03   ------------------------------------------------------------------------------------------------------------------ Invalid input(s): POCBNP ---------------------------------------------------------------------------------------------------------------  RADIOLOGY: Mr Brain Wo Contrast  11/20/2015  CLINICAL DATA:  Left arm and leg weakness and left facial droop for 2 weeks. EXAM: MRI HEAD WITHOUT CONTRAST TECHNIQUE: Multiplanar, multiecho pulse sequences of the brain and surrounding structures were obtained without intravenous contrast. COMPARISON:  Head CT 11/19/2015 FINDINGS: There is no evidence of acute infarct, intracranial hemorrhage, mass, midline shift, or extra-axial fluid collection. There is moderate cerebral atrophy, advanced for age. A small, chronic posterior left temporal lobe transcortical infarct is noted. T2 hyperintensities in the periventricular and subcortical cerebral white matter bilaterally are nonspecific but compatible with mild chronic small vessel ischemic disease. Orbits are unremarkable. Mild mucosal thickening is noted throughout the paranasal sinuses. There is a trace right mastoid effusion. Major intracranial vascular flow voids are preserved. IMPRESSION: 1. No acute intracranial abnormality. 2. Chronic left temporal lobe infarct. 3. Mild chronic small vessel ischemic disease. 4. Age advanced cerebral atrophy. Electronically Signed   By: Sebastian Ache M.D.   On: 11/20/2015 13:53    EKG:  Orders placed or performed during the hospital encounter of 11/19/15  . EKG 12-Lead  . EKG 12-Lead    ASSESSMENT AND PLAN:  Principal Problem:   Pneumonia Active Problems:   COPD exacerbation (HCC) 1. Acute respiratory failure with hypoxia due to COPD exacerbation due to pneumonia, continue oxygen therapy , weaning to room air as tolerated , but patient desaturated to 80s on exertion on room air. We'll need to stay in the hospital until patient is oxygen saturation is better. Get CT angiogram to rule out PE 2. COPD exacerbation due to pneumonia , continue steroids, DuoNeb and serum improved clinically, but O2 sats are low on exertion, getting CT angiogram to rule out PE 3. Bilateral basilar pneumonia, get sputum cultures if possible. Continue Zithromax orally,  Aspiration pneumonitis is still a possibility, SLP did not recommend no diet adjustment, but soft diet 3. left-sided weakness, no stroke on MRI, although old left-sided stroke was noted  Continue patient on aspirin therapy as well as Lipitor  , Plavix ,  supportive therapy 4. Slurring speech, appreciate speech therapist evaluation, no adjustment was recommended. However, patient is now on soft diet 5. Tobacco abuse. Continue nicotine replacement therapy 6. Acute pulmonary edema due to CHF, getting echocardiogram, continue patient on Lasix, some improved clinically , continue nitroglycerin topically to facilitate diuresis. Continue Zestril, advance dose 7. Essential hypertension, poorly controlled, continue bisoprolol as well as metoprolol, advanced lisinopril, follow in the morning 8. Generalized weakness, patient was seen by physical  therapist and home health physical therapy was recommended. Patient's wife, however, is not happy with this decision since she was expecting rehabilitation placement. Nursing staff to discuss this with her  Management plans discussed with the patient, family and  they are in agreement.   DRUG ALLERGIES: No Known Allergies  CODE STATUS:     Code Status Orders        Start     Ordered   11/19/15 2030  Do not attempt resuscitation (DNR)   Continuous    Question Answer Comment  In the event of cardiac or respiratory ARREST Do not call a "code blue"   In the event of cardiac or respiratory ARREST Do not perform Intubation, CPR, defibrillation or ACLS   In the event of cardiac or respiratory ARREST Use medication by any route, position, wound care, and other measures to relive pain and suffering. May use oxygen, suction and manual treatment of airway obstruction as needed for comfort.      11/19/15 2029      TOTAL TIME TAKING CARE OF THIS PATIENT: 35 minutes.    Katharina Caper M.D on 11/22/2015 at 12:18 PM  Between 7am to 6pm - Pager - [phone]  After 6pm go to [email or web address] - password [email or web address] [facility]  [facility]  Office  [phone]  CC: Primary care physician; No PCP Per Patient

## 2015-11-22 NOTE — Progress Notes (Signed)
*  PRELIMINARY RESULTS* Echocardiogram 2D Echocardiogram has been performed.  Garrel Ridgelikeshia S Stills 11/22/2015, 4:24 PM

## 2015-11-22 NOTE — Progress Notes (Signed)
SATURATION QUALIFICATIONS: (This note is used to comply with regulatory documentation for home oxygen)  Patient Saturations on Room Air at Rest =92 %  Patient Saturations on Room Air while Ambulating =82 %  Patient Saturations on 3 Liters of oxygen while Ambulating =95 %  Please briefly explain why patient needs home oxygen: Desats w/ activity

## 2015-11-22 NOTE — Progress Notes (Signed)
Pt alert oriented. No pain. Vss. Pt ambulated on unit w/ RN. Pt oxygen sats decreased, pt required oxygen, currently on 2 liters. Dr. Seth BakeV notified. Pt will not be discharged until weaned off of oxygen per Dr. Seth BakeV. Pt's wife prefers pt be discharged to SNF. Explained to wife that Home Health was recommended by PT.

## 2015-11-23 ENCOUNTER — Inpatient Hospital Stay: Payer: Medicare Other

## 2015-11-23 DIAGNOSIS — J9601 Acute respiratory failure with hypoxia: Secondary | ICD-10-CM

## 2015-11-23 DIAGNOSIS — Z720 Tobacco use: Secondary | ICD-10-CM

## 2015-11-23 DIAGNOSIS — J439 Emphysema, unspecified: Secondary | ICD-10-CM

## 2015-11-23 DIAGNOSIS — I10 Essential (primary) hypertension: Secondary | ICD-10-CM

## 2015-11-23 DIAGNOSIS — R531 Weakness: Secondary | ICD-10-CM

## 2015-11-23 DIAGNOSIS — I639 Cerebral infarction, unspecified: Secondary | ICD-10-CM

## 2015-11-23 DIAGNOSIS — R4781 Slurred speech: Secondary | ICD-10-CM

## 2015-11-23 LAB — GLUCOSE, CAPILLARY
GLUCOSE-CAPILLARY: 231 mg/dL — AB (ref 65–99)
GLUCOSE-CAPILLARY: 273 mg/dL — AB (ref 65–99)
Glucose-Capillary: 106 mg/dL — ABNORMAL HIGH (ref 65–99)
Glucose-Capillary: 165 mg/dL — ABNORMAL HIGH (ref 65–99)

## 2015-11-23 LAB — HEMOGLOBIN: HEMOGLOBIN: 10.3 g/dL — AB (ref 13.0–18.0)

## 2015-11-23 MED ORDER — LISINOPRIL 5 MG PO TABS: 5.0000 mg | ORAL_TABLET | Freq: Two times a day (BID) | ORAL | Status: DC

## 2015-11-23 MED ORDER — NICOTINE 21 MG/24HR TD PT24: 21.0000 mg | MEDICATED_PATCH | Freq: Every day | TRANSDERMAL | Status: DC

## 2015-11-23 MED ORDER — PREDNISONE 10 MG (21) PO TBPK: 10.0000 mg | ORAL_TABLET | Freq: Every day | ORAL | Status: DC

## 2015-11-23 MED ORDER — INSULIN GLARGINE 100 UNIT/ML ~~LOC~~ SOLN: 25.0000 [IU] | Freq: Every day | SUBCUTANEOUS | Status: AC

## 2015-11-23 MED ORDER — IPRATROPIUM-ALBUTEROL 0.5-2.5 (3) MG/3ML IN SOLN: 3.0000 mL | RESPIRATORY_TRACT | Status: DC | PRN

## 2015-11-23 MED ORDER — LEVOFLOXACIN 500 MG PO TABS: 750.0000 mg | ORAL_TABLET | Freq: Every day | ORAL | Status: DC

## 2015-11-23 MED ORDER — HYDROCOD POLST-CPM POLST ER 10-8 MG/5ML PO SUER: 5.0000 mL | Freq: Two times a day (BID) | ORAL | Status: DC | PRN

## 2015-11-23 MED ORDER — LEVOFLOXACIN 750 MG PO TABS: 750.0000 mg | ORAL_TABLET | Freq: Every day | ORAL | Status: DC

## 2015-11-23 MED ORDER — LEVOFLOXACIN 500 MG PO TABS
750.0000 mg | ORAL_TABLET | Freq: Every day | ORAL | Status: DC
  Administered 2015-11-24 – 2015-11-25 (×2): 750 mg via ORAL
  Filled 2015-11-23 (×2): qty 1

## 2015-11-23 MED ORDER — LEVOFLOXACIN 500 MG PO TABS
750.0000 mg | ORAL_TABLET | Freq: Once | ORAL | Status: AC
  Administered 2015-11-23: 750 mg via ORAL
  Filled 2015-11-23: qty 1

## 2015-11-23 MED ORDER — IPRATROPIUM-ALBUTEROL 0.5-2.5 (3) MG/3ML IN SOLN
3.0000 mL | RESPIRATORY_TRACT | Status: DC
  Administered 2015-11-23 – 2015-11-24 (×5): 3 mL via RESPIRATORY_TRACT
  Filled 2015-11-23 (×5): qty 3

## 2015-11-23 MED ORDER — METHYLPREDNISOLONE SODIUM SUCC 125 MG IJ SOLR
60.0000 mg | INTRAMUSCULAR | Status: DC
  Administered 2015-11-23 – 2015-11-25 (×3): 60 mg via INTRAVENOUS
  Filled 2015-11-23 (×3): qty 2

## 2015-11-23 NOTE — Progress Notes (Signed)
SATURATION QUALIFICATIONS: (This note is used to comply with regulatory documentation for home oxygen)  Patient Saturations on Room Air at Rest = 94%  Patient Saturations on Room Air while Ambulating = 87%  Patient Saturations on 2 Liters of oxygen while Ambulating = 97%  Please briefly explain why patient needs home oxygen: desat with walking.

## 2015-11-23 NOTE — Progress Notes (Signed)
Spoke with Dr.Vaickute regarding patient's oxygen level dropping while ambulating to 87%-88% room air. Placed on oxygen at 2L per Foley sat 97%. Discharge on hold till am. Patient okay with being discharge till tomorrow.

## 2015-11-23 NOTE — Clinical Social Work Note (Signed)
Clinical Social Worker spoke with pt's ex-wife regarding discharge plan. Pt's ex-wife would like placement as no is able to care for him at home. CSW explained the process of ALF placement with Medicaid. Pt's ex-wife wanted for pt to be placed at SNF. At time of first phone call, pt did not appear to be appropriate for SNF. However, pt's discharge was placed on hold due to new O2 requirements. Pt is now on 3L of O2. This change was noted. CSW staffed pt with Asst Director as pt may be skillable now, due to history of COPD and new O2 requirement, which MD feels may become chronic and MD feels that pt will benefit from STR. CSW spoke with pt and pt is agreeable. Pt would like to go ALF after STR. CSW spoke with pt's ex-wife to explain this. CSW encouraged ex-wife to actively participate in placement. ALF will placement will likely take place from facility. CSW initiated SNF search and will follow up with bed offers. CSW also submitted for [facility], which is now pending. CSW will continue to follow.   Dede QuerySarah Marelly Wehrman, MSW, LCSW Clinical Social Worker  [phone]

## 2015-11-23 NOTE — Progress Notes (Signed)
Mario PenceMichael Peterson is a 65 y.o. male  [id number]  Primary Cardiologist: Adrian BlackwaterShaukat Khan Reason for Consultation: Shortness of breath  HPI: 65 year old white male with a past medical history of CAB[place] three[place] to the hospital with shortness of breath and was found to have pneumonia. Echocardiogram shows normal left reticular systolic function. 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   Review of Systems: No chest pain or shortness of breath now   Past Medical History  Diagnosis Date  . Diabetes mellitus without complication (HCC)   . Hypertension   . COPD (chronic obstructive pulmonary disease) (HCC)   . MI (myocardial infarction) (HCC)     Medications Prior to Admission  Medication Sig Dispense Refill  . albuterol (PROVENTIL HFA;VENTOLIN HFA) 108 (90 Base) MCG/ACT inhaler Inhale 1-2 puffs into the lungs every 4 (four) hours as needed for wheezing or shortness of breath.     Marland Kitchen. atorvastatin (LIPITOR) 80 MG tablet Take 80 mg by mouth at bedtime.    . bisoprolol (ZEBETA) 5 MG tablet Take 5 mg by mouth daily.    . clopidogrel (PLAVIX) 75 MG tablet Take 75 mg by mouth daily.    . furosemide (LASIX) 40 MG tablet Take 40 mg by mouth daily.    . insulin glargine (LANTUS) 100 UNIT/ML injection Inject 20 Units into the skin at bedtime.    . isosorbide mononitrate (IMDUR) 30 MG 24 hr tablet Take 30 mg by mouth daily.    . metFORMIN (GLUCOPHAGE) 500 MG tablet Take 500 mg by mouth 2 (two) times daily.    . metoprolol tartrate (LOPRESSOR) 25 MG tablet Take 25 mg by mouth 2 (two) times daily.    . mirtazapine (REMERON) 15 MG tablet Take 15 mg by mouth at bedtime.    Marland Kitchen. OLANZapine (ZYPREXA) 10 MG tablet Take 10 mg by mouth at bedtime.    Marland Kitchen. omeprazole (PRILOSEC) 40 MG capsule Take 40 mg by mouth daily.    .  sitaGLIPtin (JANUVIA) 100 MG tablet Take 100 mg by mouth daily.    . SYMBICORT 160-4.5 MCG/ACT inhaler Inhale 2 puffs into the lungs 2 (two) times daily.     . tamsulosin (FLOMAX) 0.4 MG CAPS capsule Take 0.4 mg by mouth at bedtime.     . valproic acid (DEPAKENE) 250 MG capsule Take 250 mg by mouth 3 (three) times daily.        Marland Kitchen. aspirin  325 mg Oral Daily  . atorvastatin  80 mg Oral QHS  . bisoprolol  5 mg Oral Daily  . budesonide-formoterol  2 puff Inhalation BID  . clopidogrel  75 mg Oral Daily  . enoxaparin (LOVENOX) injection  40 mg Subcutaneous Q24H  . furosemide  40 mg Intravenous BID  . insulin aspart  0-20 Units Subcutaneous TID WC  . insulin aspart  0-5 Units Subcutaneous QHS  . insulin glargine  25 Units Subcutaneous QHS  . ipratropium-albuterol  3 mL Nebulization Q4H  . isosorbide mononitrate  30 mg Oral Daily  . levofloxacin  750 mg Oral Once  . [START ON 11/24/2015] levofloxacin  750 mg Oral Daily  . lisinopril  5 mg Oral BID  . methylPREDNISolone (SOLU-MEDROL) injection  60 mg Intravenous Q24H  . metoprolol tartrate  25 mg Oral BID  . mirtazapine  15 mg Oral QHS  . nicotine  21 mg Transdermal Daily  . nitroGLYCERIN  0.5 inch Topical 4 times per day  . OLANZapine  10 mg Oral QHS  . pantoprazole  40 mg Oral Daily  . sodium chloride  3 mL Intravenous Q12H  . tamsulosin  0.4 mg Oral QHS  . valproic acid  250 mg Oral TID    Infusions:    No Known Allergies  Social History   Social History  . Marital Status: Single    Spouse Name: N/A  . Number of Children: N/A  . Years of Education: N/A   Occupational History  . Not on file.   Social History Main Topics  . Smoking status: Current Every Day Smoker -- 1.50 packs/day for 53 years  . Smokeless tobacco: Never Used  . Alcohol Use: No  . Drug Use: No  . Sexual Activity: Not on file   Other Topics Concern  . Not on file   Social History Narrative    Family History  Problem Relation Age of Onset  .  Diabetes Mother   . Lung cancer Father     PHYSICAL EXAM: Filed Vitals:   11/23/15 0721 11/23/15 0723  BP: 181/60 153/61  Pulse: 59 60  Temp: 98.2 F (36.8 C)   Resp: 19      Intake/Output Summary (Last 24 hours) at 11/23/15 1247 Last data filed at 11/23/15 1153  Gross per 24 hour  Intake    720 ml  Output   2275 ml  Net  -1555 ml    General:  Well appearing. No respiratory difficulty HEENT: normal Neck: supple. no JVD. Carotids 2+ bilat; no bruits. No lymphadenopathy or thryomegaly appreciated. Cor: PMI nondisplaced. Regular rate & rhythm. No rubs, gallops or murmurs. Lungs: clear Abdomen: soft, nontender, nondistended. No hepatosplenomegaly. No bruits or masses. Good bowel sounds. Extremities: no cyanosis, clubbing, rash, edema Neuro: alert & oriented x 3, cranial nerves grossly intact. moves all 4 extremities w/o difficulty. Affect pleasant.  ECG: Sinus rhythm no acute changes  Results for orders placed or performed during the hospital encounter of 11/19/15 (from the past 24 hour(s))  Glucose, capillary     Status: Abnormal   Collection Time: 11/22/15  4:27 PM  Result Value Ref Range   Glucose-Capillary 315 (H) 65 - 99 mg/dL   Comment 1 Notify RN   Glucose, capillary     Status: Abnormal   Collection Time: 11/22/15  9:48 PM  Result Value Ref Range   Glucose-Capillary 179 (H) 65 - 99 mg/dL   Comment 1 Notify RN   Hemoglobin     Status: Abnormal   Collection Time: 11/23/15  4:02 AM  Result Value Ref Range   Hemoglobin 10.3 (L) 13.0 - 18.0 g/dL  Glucose, capillary     Status: Abnormal   Collection Time: 11/23/15  7:20 AM  Result Value Ref Range   Glucose-Capillary 106 (H) 65 - 99 mg/dL  Glucose, capillary     Status: Abnormal   Collection Time: 11/23/15 11:15 AM  Result Value Ref Range   Glucose-Capillary 165 (H) 65 - 99 mg/dL   Ct Angio Chest Pe W/cm &/or Wo Cm  11/22/2015  CLINICAL DATA:  Initial encounter for several month history of cough and now with  hypoxia EXAM: CT ANGIOGRAPHY CHEST WITH CONTRAST TECHNIQUE: Multidetector CT imaging of the chest was performed using the standard protocol during bolus administration of intravenous contrast. Multiplanar CT image reconstructions and MIPs were obtained to evaluate the vascular anatomy. CONTRAST:  OMNIPAQUE  IOHEXOL 350 MG/ML SOLN COMPARISON:  None. FINDINGS: Mediastinum / Lymph Nodes: There is no axillary lymphadenopathy. 1.8 cm right thyroid nodule associated with other smaller nodules in the right thyroid gland. Scattered small lymph nodes are seen in the mediastinum. 14 mm short axis right hilar lymph node identified. Heart size is enlarged. Patient is status post CABG. The esophagus has normal imaging features. No evidence for filling defect in the opacified pulmonary arteries to suggest the presence of an acute pulmonary embolus. No evidence for dissection flap in the thoracic aorta. Lungs / Pleura: Changes of emphysema noted bilaterally. Bilateral lower lobe collapse/ consolidation noted dependently, left greater than right. Small left greater than right pleural effusions are associated. Upper Abdomen:  Unremarkable. MSK / Soft Tissues: Bone windows reveal no worrisome lytic or sclerotic osseous lesions. Review of the MIP images confirms the above findings. IMPRESSION: 1. No CT evidence for acute pulmonary embolus. 2. Bilateral lower lobe collapse/consolidation with small bilateral pleural effusions 3. Emphysema. Electronically Signed   By: Kennith Center M.D.   On: 11/22/2015 13:20     ASSESSMENT AND PLAN: Patient admitted with pneumonia with history of severe coronary artery disease three-vessel disease and carotid disease. Ejection fraction on echocardiogram is normal agree with discharge with follow-up in the office next Tuesday at 10:00. This would be not this coming Tuesday but the following Tuesday.  KHAN,SHAUKAT A

## 2015-11-23 NOTE — Care Management Note (Signed)
Case Management Note  Patient Details  Name: Isla PenceMichael Dechellis MRN: [id number] Date of Birth: February 09, 1951  Subjective/Objective:       [name] has a list of home health providers at bedside. He wants to wait for a friend to arrive before he decides upon which home health provider to choose.              Action/Plan:   Expected Discharge Date:                  Expected Discharge Plan:  Home w Home Health Services  In-House Referral:  Clinical Social Work  Discharge planning Services  CM Consult  Post Acute Care Choice:  Home Health Choice offered to:  Spouse, Patient  DME Arranged:    DME Agency:     HH Arranged:    HH Agency:     Status of Service:  In process, will continue to follow  Medicare Important Message Given:  Yes Date Medicare IM Given:    Medicare IM give by:    Date Additional Medicare IM Given:    Additional Medicare Important Message give by:     If discussed at Long Length of Stay Meetings, dates discussed:    Additional Comments:   Cohick A, RN 11/23/2015, 11:25 AM

## 2015-11-23 NOTE — Care Management Note (Signed)
Case Management Note  Patient Details  Name: Mario PenceMichael Peterson MRN: [id number] Date of Birth: 1951/05/11  Subjective/Objective:    Discharge is on hold today. [name] is desatting down to 88-87% and will need a re-evaluation of oxygen saturations on Monday to determine whether he needs new home oxygen therapy. [name] has a list of home health providers from which to choose.                 Action/Plan:   Expected Discharge Date:                  Expected Discharge Plan:  Home w Home Health Services  In-House Referral:  Clinical Social Work  Discharge planning Services  CM Consult  Post Acute Care Choice:  Home Health Choice offered to:  Spouse, Patient  DME Arranged:    DME Agency:     HH Arranged:    HH Agency:     Status of Service:  In process, will continue to follow  Medicare Important Message Given:  Yes Date Medicare IM Given:    Medicare IM give by:    Date Additional Medicare IM Given:    Additional Medicare Important Message give by:     If discussed at Long Length of Stay Meetings, dates discussed:    Additional Comments:  Kc Summerson A, RN 11/23/2015, 1:45 PM

## 2015-11-23 NOTE — Progress Notes (Signed)
Patient going for ultrasound -carotid bilateral.

## 2015-11-23 NOTE — Discharge Summary (Addendum)
[facility] - [place] at [facility]   PATIENT NAME: Mario Peterson    MR#:  [id number]  DATE OF BIRTH:  1951/01/17  DATE OF ADMISSION:  11/19/2015 ADMITTING PHYSICIAN: Shaune Pollack, MD  DATE OF DISCHARGE: No discharge date for patient encounter.  PRIMARY CARE PHYSICIAN: No PCP Per Patient     ADMISSION DIAGNOSIS:  Community acquired pneumonia [J18.9] COPD exacerbation (HCC) [J44.1] Left-sided weakness [M62.89]  DISCHARGE DIAGNOSIS:  Principal Problem:   COPD exacerbation (HCC) Active Problems:   Pneumonia   Acute respiratory failure with hypoxia (HCC)   Emphysema lung (HCC)   CVA (cerebral infarction)   Slurring of speech   Tobacco abuse   Essential hypertension   Generalized weakness   SECONDARY DIAGNOSIS:   Past Medical History  Diagnosis Date  . Diabetes mellitus without complication (HCC)   . Hypertension   . COPD (chronic obstructive pulmonary disease) (HCC)   . MI (myocardial infarction) (HCC)     .pro HOSPITAL COURSE:   The patient is 65 year old Caucasian male with past medical history significant for history of diabetes, hypertension, COPD, coronary artery disease who presents to the hospital with complaints of shortness of breath, cough, phlegm production, wheezing, fevers and chills. He also noted slight weakness in left upper and lower extremity for the past 2 weeks. In emergency room, his mother cell count was found to be elevated to 13,000. Chest x-ray was concerning for bilateral basilar infiltrates. Patient was admitted for therapy. He was initiated on broad-spectrum antibiotic therapy, steroids, inhalation therapy, nebulizers as well as oxygen. He was also noted to have significant crackles in the chest on auscultation, concerning for congestive heart failure. Echocardiogram was performed , but results of it is still pending. Patient was diuresed. In regards to left-sided weakness, patient underwent MRI of his brain which revealed  old stroke in the left temporal area . Because of concern of dysphagia related to old stroke, the patient was evaluated by speech therapist and recommended soft diet with thin liquids . He was also recommended to continue atorvastatin and Plavix.  Overall, with conservative medical management, the patient's condition improved significantly , he was weaned off oxygen and was very comfortable. He was felt to be stable to be discharged home today. Patient was seen by physical therapist and recommended home health physical therapy.  Discussion by problem 1. Acute respiratory failure with hypoxia due to COPD exacerbation due to pneumonia, weaned off oxygen therapy. CT angiogram showed no  PE, but pneumonia and emphysema as well as small bilateral pleural effusions. The patient may need to be prescribed oxygen for home. Will ambulate patient and follow his O2 sats.  2. COPD exacerbation due to pneumonia , taper steroids, DuoNeb, provide nebulizing machine and oxygen if needed. Continue levofloxacin to complete course 3. Bilateral basilar pneumonia, unable to get sputum cultures. Continue levofloxacin orally, Aspiration pneumonitis is still a possibility, SLP did not recommend no diet adjustment, but soft diet 3. left-sided weakness, no new stroke on MRI, although old left-sided stroke was noted Continue patient on Plavix ,  Lipitor , , supportive therapy. The patient was evaluated by physical therapist and home health physical therapy was recommended.  4. Slurring speech, appreciate speech therapist evaluation, no dietary adjustment was recommended. However, patient is to continue soft diet 5. Tobacco abuse. Continue nicotine replacement therapy 6. Acute pulmonary edema due to acute CHF, pending echocardiogram, continue patient on home dose of Lasix, improved clinically .  Continue Zestril, advanced dose to twice  a day. Blood pressure remains high at 150. Reassess it as outpatient and make decisions about  advancement of lisinopril even higher if needed 7. Essential hypertension, poorly controlled, continue bisoprolol as well as metoprolol, advanced lisinopril, blood pressure remains high at 150. As above  8. Generalized weakness, patient was seen by physical therapist and home health physical therapy was recommended. Patient's ex-wife, however, is not happy with this decision since she was planning assisted living facility placement. Care management to be informed.   DISCHARGE CONDITIONS:   Stable  CONSULTS OBTAINED:  Treatment Team:  Laurier Nancy, MD  DRUG ALLERGIES:  No Known Allergies  DISCHARGE MEDICATIONS:   Current Discharge Medication List    START taking these medications   Details  chlorpheniramine-HYDROcodone (TUSSIONEX PENNKINETIC ER) 10-8 MG/5ML SUER Take 5 mLs by mouth every 12 (twelve) hours as needed for cough. Qty: 140 mL, Refills: 0    levofloxacin (LEVAQUIN) 750 MG tablet Take 1 tablet (750 mg total) by mouth daily. Qty: 5 tablet, Refills: 0    lisinopril (PRINIVIL,ZESTRIL) 5 MG tablet Take 1 tablet (5 mg total) by mouth 2 (two) times daily. Qty: 60 tablet, Refills: 5    nicotine (NICODERM CQ - DOSED IN MG/24 HOURS) 21 mg/24hr patch Place 1 patch (21 mg total) onto the skin daily. Qty: 28 patch, Refills: 0      CONTINUE these medications which have CHANGED   Details  insulin glargine (LANTUS) 100 UNIT/ML injection Inject 0.25 mLs (25 Units total) into the skin at bedtime. Qty: 10 mL, Refills: 11      CONTINUE these medications which have NOT CHANGED   Details  albuterol (PROVENTIL HFA;VENTOLIN HFA) 108 (90 Base) MCG/ACT inhaler Inhale 1-2 puffs into the lungs every 4 (four) hours as needed for wheezing or shortness of breath.     atorvastatin (LIPITOR) 80 MG tablet Take 80 mg by mouth at bedtime.    bisoprolol (ZEBETA) 5 MG tablet Take 5 mg by mouth daily.    clopidogrel (PLAVIX) 75 MG tablet Take 75 mg by mouth daily.    furosemide (LASIX) 40 MG  tablet Take 40 mg by mouth daily.    isosorbide mononitrate (IMDUR) 30 MG 24 hr tablet Take 30 mg by mouth daily.    metFORMIN (GLUCOPHAGE) 500 MG tablet Take 500 mg by mouth 2 (two) times daily.    metoprolol tartrate (LOPRESSOR) 25 MG tablet Take 25 mg by mouth 2 (two) times daily.    mirtazapine (REMERON) 15 MG tablet Take 15 mg by mouth at bedtime.    OLANZapine (ZYPREXA) 10 MG tablet Take 10 mg by mouth at bedtime.    omeprazole (PRILOSEC) 40 MG capsule Take 40 mg by mouth daily.    sitaGLIPtin (JANUVIA) 100 MG tablet Take 100 mg by mouth daily.    SYMBICORT 160-4.5 MCG/ACT inhaler Inhale 2 puffs into the lungs 2 (two) times daily.     tamsulosin (FLOMAX) 0.4 MG CAPS capsule Take 0.4 mg by mouth at bedtime.     valproic acid (DEPAKENE) 250 MG capsule Take 250 mg by mouth 3 (three) times daily.          DISCHARGE INSTRUCTIONS:    Patient is to follow-up with primary care physician within one week or as outpatient  If you experience worsening of your admission symptoms, develop shortness of breath, life threatening emergency, suicidal or homicidal thoughts you must seek medical attention immediately by calling 911 or calling your MD immediately  if symptoms less severe.  You Must read complete instructions/literature along with all the possible adverse reactions/side effects for all the Medicines you take and that have been prescribed to you. Take any new Medicines after you have completely understood and accept all the possible adverse reactions/side effects.   Please note  You were cared for by a hospitalist during your hospital stay. If you have any questions about your discharge medications or the care you received while you were in the hospital after you are discharged, you can call the unit and asked to speak with the hospitalist on call if the hospitalist that took care of you is not available. Once you are discharged, your primary care physician will handle any further  medical issues. Please note that NO REFILLS for any discharge medications will be authorized once you are discharged, as it is imperative that you return to your primary care physician (or establish a relationship with a primary care physician if you do not have one) for your aftercare needs so that they can reassess your need for medications and monitor your lab values.    Today   CHIEF COMPLAINT:   Chief Complaint  Patient presents with  . Shortness of Breath    HISTORY OF PRESENT ILLNESS:  Mario Peterson  is a 65 y.o. male with a known history of diabetes, hypertension, COPD, coronary artery disease who presents to the hospital with complaints of shortness of breath, cough, phlegm production, wheezing, fevers and chills. He also noted slight weakness in left upper and lower extremity for the past 2 weeks. In emergency room, his mother cell count was found to be elevated to 13,000. Chest x-ray was concerning for bilateral basilar infiltrates. Patient was admitted for therapy. He was initiated on broad-spectrum antibiotic therapy, steroids, inhalation therapy, nebulizers as well as oxygen. He was also noted to have significant crackles in the chest on auscultation, concerning for congestive heart failure. Echocardiogram was performed , but results of it is still pending. Patient was diuresed. In regards to left-sided weakness, patient underwent MRI of his brain which revealed old stroke in the left temporal area . Because of concern of dysphagia related to old stroke, the patient was evaluated by speech therapist and recommended soft diet with thin liquids . He was also recommended to continue atorvastatin and Plavix.  Overall, with conservative medical management, the patient's condition improved significantly , he was weaned off oxygen and was very comfortable. He was felt to be stable to be discharged home today. Patient was seen by physical therapist and recommended home health physical therapy.   Discussion by problem 1. Acute respiratory failure with hypoxia due to COPD exacerbation due to pneumonia, weaned off oxygen therapy. CT angiogram showed no  PE, but pneumonia and emphysema as well as small bilateral pleural effusions. The patient may need to be prescribed oxygen for home. Will ambulate patient and follow his O2 sats. The patient is to follow-up with pulmonologist as outpatient 2. COPD exacerbation due to pneumonia , taper steroids, DuoNeb, provide nebulizing machine and oxygen if needed. Continue levofloxacin to complete course 3. Bilateral basilar pneumonia, unable to get sputum cultures. Continue levofloxacin orally, Aspiration pneumonitis is still a possibility, SLP did not recommend no diet adjustment, but soft diet 3. left-sided weakness, no new stroke on MRI, although old left-sided stroke was noted Continue patient on Plavix ,  Lipitor , , supportive therapy. The patient was evaluated by physical therapist and home health physical therapy was recommended.  4. Slurring speech, appreciate speech therapist  evaluation, no dietary adjustment was recommended. However, patient is to continue soft diet 5. Tobacco abuse. Continue nicotine replacement therapy 6. Acute pulmonary edema due to acute CHF, pending echocardiogram, continue patient on home dose of Lasix, improved clinically .  Continue Zestril, advanced dose to twice a day. Blood pressure remains high at 150. Reassess it as outpatient and make decisions about advancement of lisinopril even higher if needed 7. Essential hypertension, poorly controlled, continue bisoprolol as well as metoprolol, advanced lisinopril, blood pressure remains high at 150. As above  8. Generalized weakness, patient was seen by physical therapist and home health physical therapy was recommended. Patient's ex-wife, however, is not happy with this decision since she was planning assisted living facility placement. Care management to be informed.      VITAL SIGNS:  Blood pressure 153/61, pulse 60, temperature 98.2 F (36.8 C), temperature source Oral, resp. rate 19, height 5\' 8"  (1.727 m), weight 84.324 kg (185 lb 14.4 oz), SpO2 93 %.  I/O:   Intake/Output Summary (Last 24 hours) at 11/23/15 1202 Last data filed at 11/23/15 1153  Gross per 24 hour  Intake    720 ml  Output   2275 ml  Net  -1555 ml    PHYSICAL EXAMINATION:  GENERAL:  65 y.o.-year-old patient lying in the bed with no acute distress.  EYES: Pupils equal, round, reactive to light and accommodation. No scleral icterus. Extraocular muscles intact.  HEENT: Head atraumatic, normocephalic. Oropharynx and nasopharynx clear.  NECK:  Supple, no jugular venous distention. No thyroid enlargement, no tenderness.  LUNGS: Some diminished breath sounds bilaterally, no wheezing, rales,rhonchi or crepitation. No use of accessory muscles of respiration.  CARDIOVASCULAR: S1, S2 normal. No murmurs, rubs, or gallops.  ABDOMEN: Soft, non-tender, non-distended. Bowel sounds present. No organomegaly or mass.  EXTREMITIES: No pedal edema, cyanosis, or clubbing.  NEUROLOGIC: Cranial nerves II through XII are intact. Muscle strength 5/5 in all extremities. Sensation intact. Gait not checked.  PSYCHIATRIC: The patient is alert and oriented x 3.  SKIN: No obvious rash, lesion, or ulcer.   DATA REVIEW:   CBC  Recent Labs Lab 11/22/15 0305 11/23/15 0402  WBC 15.1*  --   HGB 9.4* 10.3*  HCT 29.4*  --   PLT 189  --     Chemistries   Recent Labs Lab 11/19/15 1740  11/21/15 2019 11/22/15 0305  NA 141  --   --   --   K 4.7  --   --   --   CL 101  --   --   --   CO2 32  --   --   --   GLUCOSE 128*  < > 491*  --   BUN 20  --   --   --   CREATININE 0.81  < >  --  0.74  CALCIUM 9.4  --   --   --   AST 14*  --   --   --   ALT 10*  --   --   --   ALKPHOS 59  --   --   --   BILITOT 0.5  --   --   --   < > = values in this interval not displayed.  Cardiac Enzymes  Recent  Labs Lab 11/20/15 0815  TROPONINI <0.03    Microbiology Results  Results for orders placed or performed during the hospital encounter of 07/06/15  C difficile quick scan w PCR reflex  Status: None   Collection Time: 07/07/15  9:21 PM  Result Value Ref Range Status   C Diff antigen NEGATIVE NEGATIVE Final   C Diff toxin NEGATIVE NEGATIVE Final   C Diff interpretation Negative for C. difficile  Final    RADIOLOGY:  Ct Angio Chest Pe W/cm &/or Wo Cm  11/22/2015  CLINICAL DATA:  Initial encounter for several month history of cough and now with hypoxia EXAM: CT ANGIOGRAPHY CHEST WITH CONTRAST TECHNIQUE: Multidetector CT imaging of the chest was performed using the standard protocol during bolus administration of intravenous contrast. Multiplanar CT image reconstructions and MIPs were obtained to evaluate the vascular anatomy. CONTRAST:  100mL OMNIPAQUE IOHEXOL 350 MG/ML SOLN COMPARISON:  None. FINDINGS: Mediastinum / Lymph Nodes: There is no axillary lymphadenopathy. 1.8 cm right thyroid nodule associated with other smaller nodules in the right thyroid gland. Scattered small lymph nodes are seen in the mediastinum. 14 mm short axis right hilar lymph node identified. Heart size is enlarged. Patient is status post CABG. The esophagus has normal imaging features. No evidence for filling defect in the opacified pulmonary arteries to suggest the presence of an acute pulmonary embolus. No evidence for dissection flap in the thoracic aorta. Lungs / Pleura: Changes of emphysema noted bilaterally. Bilateral lower lobe collapse/ consolidation noted dependently, left greater than right. Small left greater than right pleural effusions are associated. Upper Abdomen:  Unremarkable. MSK / Soft Tissues: Bone windows reveal no worrisome lytic or sclerotic osseous lesions. Review of the MIP images confirms the above findings. IMPRESSION: 1. No CT evidence for acute pulmonary embolus. 2. Bilateral lower lobe  collapse/consolidation with small bilateral pleural effusions 3. Emphysema. Electronically Signed   By: Kennith CenterEric  Mansell M.D.   On: 11/22/2015 13:20    EKG:   Orders placed or performed during the hospital encounter of 11/19/15  . EKG 12-Lead  . EKG 12-Lead      Management plans discussed with the patient, family and they are in agreement.  CODE STATUS:     Code Status Orders        Start     Ordered   11/19/15 2030  Do not attempt resuscitation (DNR)   Continuous    Question Answer Comment  In the event of cardiac or respiratory ARREST Do not call a "code blue"   In the event of cardiac or respiratory ARREST Do not perform Intubation, CPR, defibrillation or ACLS   In the event of cardiac or respiratory ARREST Use medication by any route, position, wound care, and other measures to relive pain and suffering. May use oxygen, suction and manual treatment of airway obstruction as needed for comfort.      11/19/15 2029      TOTAL TIME TAKING CARE OF THIS PATIENT: 40 minutes.    Katharina CaperVAICKUTE,Luccia Reinheimer M.D on 11/23/2015 at 12:02 PM  Between 7am to 6pm - Pager - [phone]  After 6pm go to [email or web address] - password [email or web address] [facility]  [facility]  Office  [phone]  CC: Primary care physician; No PCP Per Patient

## 2015-11-23 NOTE — [place] FL2 (Signed)
[place] MEDICAID FL2 LEVEL OF CARE SCREENING TOOL     IDENTIFICATION  Patient Name: Mario PenceMichael Vallance Birthdate: February 18, 1951 Sex: male Admission Date (Current Location): 11/19/2015  [place]   Facility and Address:  [facility], [address], [place], [place] [address]      Provider Number: [id number]  Attending Physician Name and Address:  Katharina Caperima Vaickute, MD  Relative Name and Phone Number:       Current Level of Care: Hospital Recommended Level of Care: Skilled Nursing Facility Prior Approval Number:    Date Approved/Denied:   PASRR Number:    Discharge Plan: Domiciliary (Rest home)    Current Diagnoses: Patient Active Problem List   Diagnosis Date Noted  . Acute respiratory failure with hypoxia (HCC) 11/23/2015  . Emphysema lung (HCC) 11/23/2015  . CVA (cerebral infarction) 11/23/2015  . Slurring of speech 11/23/2015  . Tobacco abuse 11/23/2015  . Essential hypertension 11/23/2015  . Generalized weakness 11/23/2015  . Pneumonia 11/19/2015  . COPD exacerbation (HCC) 11/19/2015  . Chest pain 07/06/2015    Orientation RESPIRATION BLADDER Height & Weight    Self, Time, Situation  O2 (3L) Continent 5\' 8"  (172.7 cm) 185 lbs.  BEHAVIORAL SYMPTOMS/MOOD NEUROLOGICAL BOWEL NUTRITION STATUS      Continent Diet (Soft Diet)  AMBULATORY STATUS COMMUNICATION OF NEEDS Skin   Limited Assist Verbally                         Personal Care Assistance Level of Assistance  Bathing, Feeding, Dressing Bathing Assistance: Limited assistance Feeding assistance: Limited assistance Dressing Assistance: Limited assistance     Functional Limitations Info  Sight, Hearing, Speech Sight Info: Adequate Hearing Info: Adequate Speech Info: Adequate    SPECIAL CARE FACTORS FREQUENCY  PT (By licensed PT), Speech therapy     PT Frequency: 5       Speech Therapy Frequency: 5      Contractures      Additional  Factors Info  Code Status, Allergies Code Status Info: DNR Allergies Info: No known allergies           Current Medications (11/23/2015):  This is the current hospital active medication list Current Facility-Administered Medications  Medication Dose Route Frequency Provider Last Rate Last Dose  . 0.9 %  sodium chloride infusion  250 mL Intravenous PRN Shaune PollackQing Chen, MD      . aspirin tablet 325 mg  325 mg Oral Daily Shaune PollackQing Chen, MD   325 mg at 11/23/15 0906  . atorvastatin (LIPITOR) tablet 80 mg  80 mg Oral QHS Shaune PollackQing Chen, MD   80 mg at 11/22/15 2125  . benzonatate (TESSALON) capsule 100 mg  100 mg Oral TID PRN Shaune PollackQing Chen, MD      . bisoprolol (ZEBETA) tablet 5 mg  5 mg Oral Daily Shaune PollackQing Chen, MD   5 mg at 11/23/15 0906  . budesonide-formoterol (SYMBICORT) 160-4.5 MCG/ACT inhaler 2 puff  2 puff Inhalation BID Shaune PollackQing Chen, MD   2 puff at 11/23/15 0906  . clopidogrel (PLAVIX) tablet 75 mg  75 mg Oral Daily Shaune PollackQing Chen, MD   75 mg at 11/23/15 0906  . enoxaparin (LOVENOX) injection 40 mg  40 mg Subcutaneous Q24H Shaune PollackQing Chen, MD   40 mg at 11/22/15 2124  . furosemide (LASIX) injection 40 mg  40 mg Intravenous BID Katharina Caperima Vaickute, MD   40 mg at 11/23/15 0739  . hydrALAZINE (APRESOLINE) injection 10 mg  10 mg Intravenous Q6H PRN Shaune Pollack, MD      . insulin aspart (novoLOG) injection 0-20 Units  0-20 Units Subcutaneous TID WC Katharina Caper, MD   4 Units at 11/23/15 1132  . insulin aspart (novoLOG) injection 0-5 Units  0-5 Units Subcutaneous QHS Katharina Caper, MD   5 Units at 11/21/15 2246  . insulin glargine (LANTUS) injection 25 Units  25 Units Subcutaneous QHS Katharina Caper, MD   25 Units at 11/22/15 2126  . ipratropium-albuterol (DUONEB) 0.5-2.5 (3) MG/3ML nebulizer solution 3 mL  3 mL Nebulization Q4H Katharina Caper, MD   3 mL at 11/23/15 1402  . isosorbide mononitrate (IMDUR) 24 hr tablet 30 mg  30 mg Oral Daily Shaune Pollack, MD   30 mg at 11/23/15 0906  . [START ON 11/24/2015] levofloxacin (LEVAQUIN) tablet 750 mg   750 mg Oral Daily Sherry Ruffing, RPH      . lisinopril (PRINIVIL,ZESTRIL) tablet 5 mg  5 mg Oral BID Katharina Caper, MD   5 mg at 11/23/15 0906  . methylPREDNISolone sodium succinate (SOLU-MEDROL) 125 mg/2 mL injection 60 mg  60 mg Intravenous Q24H Katharina Caper, MD   60 mg at 11/23/15 1253  . metoprolol tartrate (LOPRESSOR) tablet 25 mg  25 mg Oral BID Shaune Pollack, MD   25 mg at 11/23/15 0905  . mirtazapine (REMERON) tablet 15 mg  15 mg Oral QHS Shaune Pollack, MD   15 mg at 11/22/15 2125  . nicotine (NICODERM CQ - dosed in mg/24 hours) patch 21 mg  21 mg Transdermal Daily Shaune Pollack, MD   21 mg at 11/23/15 0906  . nitroGLYCERIN (NITROGLYN) 2 % ointment 0.5 inch  0.5 inch Topical 4 times per day Katharina Caper, MD   0.5 inch at 11/23/15 1132  . OLANZapine (ZYPREXA) tablet 10 mg  10 mg Oral QHS Shaune Pollack, MD   10 mg at 11/22/15 2125  . pantoprazole (PROTONIX) EC tablet 40 mg  40 mg Oral Daily Shaune Pollack, MD   40 mg at 11/23/15 0905  . sodium chloride 0.9 % injection 3 mL  3 mL Intravenous Q12H Shaune Pollack, MD   3 mL at 11/23/15 0909  . sodium chloride 0.9 % injection 3 mL  3 mL Intravenous PRN Shaune Pollack, MD      . tamsulosin [facility]) capsule 0.4 mg  0.4 mg Oral QHS Shaune Pollack, MD   0.4 mg at 11/22/15 2125  . valproic acid (DEPAKENE) 250 MG capsule 250 mg  250 mg Oral TID Shaune Pollack, MD   250 mg at 11/23/15 0906     Discharge Medications: Please see discharge summary for a list of discharge medications.  Relevant Imaging Results:  Relevant Lab Results:   Additional Information SSN:  [id number]  Dede Query, LCSW

## 2015-11-24 LAB — CBC
HEMATOCRIT: 31.5 % — AB (ref 40.0–52.0)
Hemoglobin: 10.3 g/dL — ABNORMAL LOW (ref 13.0–18.0)
MCH: 24.2 pg — AB (ref 26.0–34.0)
MCHC: 32.7 g/dL (ref 32.0–36.0)
MCV: 73.9 fL — AB (ref 80.0–100.0)
Platelets: 203 10*3/uL (ref 150–440)
RBC: 4.26 MIL/uL — AB (ref 4.40–5.90)
RDW: 18.3 % — AB (ref 11.5–14.5)
WBC: 10 10*3/uL (ref 3.8–10.6)

## 2015-11-24 LAB — BASIC METABOLIC PANEL
Anion gap: 6 (ref 5–15)
BUN: 35 mg/dL — AB (ref 6–20)
CHLORIDE: 99 mmol/L — AB (ref 101–111)
CO2: 34 mmol/L — AB (ref 22–32)
Calcium: 8.4 mg/dL — ABNORMAL LOW (ref 8.9–10.3)
Creatinine, Ser: 0.81 mg/dL (ref 0.61–1.24)
GFR calc Af Amer: >60 mL/min (ref >60)
GFR calc non Af Amer: >60 mL/min (ref >60)
Glucose, Bld: 200 mg/dL — ABNORMAL HIGH (ref 65–99)
POTASSIUM: 4 mmol/L (ref 3.5–5.1)
SODIUM: 139 mmol/L (ref 135–145)

## 2015-11-24 LAB — GLUCOSE, CAPILLARY
GLUCOSE-CAPILLARY: 192 mg/dL — AB (ref 65–99)
GLUCOSE-CAPILLARY: 329 mg/dL — AB (ref 65–99)
Glucose-Capillary: 289 mg/dL — ABNORMAL HIGH (ref 65–99)
Glucose-Capillary: 360 mg/dL — ABNORMAL HIGH (ref 65–99)

## 2015-11-24 MED ORDER — LISINOPRIL 10 MG PO TABS
10.0000 mg | ORAL_TABLET | Freq: Two times a day (BID) | ORAL | Status: DC
  Administered 2015-11-24 – 2015-11-25 (×2): 10 mg via ORAL
  Filled 2015-11-24 (×2): qty 1

## 2015-11-24 MED ORDER — IPRATROPIUM-ALBUTEROL 0.5-2.5 (3) MG/3ML IN SOLN
3.0000 mL | Freq: Four times a day (QID) | RESPIRATORY_TRACT | Status: DC
  Administered 2015-11-25: 3 mL via RESPIRATORY_TRACT
  Filled 2015-11-24 (×2): qty 3

## 2015-11-24 NOTE — Care Management Important Message (Signed)
Important Message  Patient Details  Name: Mario PenceMichael Peterson MRN: [id number] Date of Birth: 20-Jan-1951   Medicare Important Message Given:  Yes    Kacey Vicuna A, RN 11/24/2015, 8:41 AM

## 2015-11-24 NOTE — Clinical Social Work Note (Signed)
Provided bed offers. [facility] was chosen and facility was updated. Pt will be able to discharge as soon as PASARR is obtained.Provided ALF list to address LTC placement. CSW will continue to follow.   Dede QuerySarah Lambert Jeanty, MSW, LCSW Clinical Social Worker  [phone]

## 2015-11-24 NOTE — [place] FL2 (Signed)
[place] MEDICAID FL2 LEVEL OF CARE SCREENING TOOL     IDENTIFICATION  Patient Name: Mario Peterson Birthdate: 06/10/1951 Sex: male Admission Date (Current Location): 11/19/2015  [place] and [place] Number:  Chiropodist and Address:  [facility], [address], [place], [place] [address]      Provider Number: [id number]  Attending Physician Name and Address:  Katharina Caper, MD  Relative Name and Phone Number:       Current Level of Care: Hospital Recommended Level of Care: Skilled Nursing Facility Prior Approval Number:    Date Approved/Denied:   PASRR Number:    Discharge Plan: Domiciliary (Rest home)    Current Diagnoses: Patient Active Problem List   Diagnosis Date Noted  . Acute respiratory failure with hypoxia (HCC) 11/23/2015  . Emphysema lung (HCC) 11/23/2015  . CVA (cerebral infarction) 11/23/2015  . Slurring of speech 11/23/2015  . Tobacco abuse 11/23/2015  . Essential hypertension 11/23/2015  . Generalized weakness 11/23/2015  . Pneumonia 11/19/2015  . COPD exacerbation (HCC) 11/19/2015  . Chest pain 07/06/2015    Orientation RESPIRATION BLADDER Height & Weight    Self, Time, Situation  O2 (3L) Continent 5\' 8"  (172.7 cm) 185 lbs.  BEHAVIORAL SYMPTOMS/MOOD NEUROLOGICAL BOWEL NUTRITION STATUS      Continent Diet (Soft Diet)  AMBULATORY STATUS COMMUNICATION OF NEEDS Skin   Limited Assist Verbally                         Personal Care Assistance Level of Assistance  Bathing, Feeding, Dressing Bathing Assistance: Limited assistance Feeding assistance: Limited assistance Dressing Assistance: Limited assistance     Functional Limitations Info  Sight, Hearing, Speech Sight Info: Adequate Hearing Info: Adequate Speech Info: Adequate    SPECIAL CARE FACTORS FREQUENCY  PT (By licensed PT), Speech therapy     PT Frequency: 5       Speech Therapy Frequency: 5      Contractures      Additional  Factors Info  Code Status, Allergies Code Status Info: DNR Allergies Info: No known allergies           Current Medications (11/24/2015):  This is the current hospital active medication list Current Facility-Administered Medications  Medication Dose Route Frequency Provider Last Rate Last Dose  . 0.9 %  sodium chloride infusion  250 mL Intravenous PRN Shaune Pollack, MD      . aspirin tablet 325 mg  325 mg Oral Daily Shaune Pollack, MD   325 mg at 11/24/15 0836  . atorvastatin (LIPITOR) tablet 80 mg  80 mg Oral QHS Shaune Pollack, MD   80 mg at 11/23/15 2105  . benzonatate (TESSALON) capsule 100 mg  100 mg Oral TID PRN Shaune Pollack, MD      . bisoprolol (ZEBETA) tablet 5 mg  5 mg Oral Daily Shaune Pollack, MD   5 mg at 11/24/15 0835  . budesonide-formoterol (SYMBICORT) 160-4.5 MCG/ACT inhaler 2 puff  2 puff Inhalation BID Shaune Pollack, MD   2 puff at 11/24/15 [phone]  . clopidogrel (PLAVIX) tablet 75 mg  75 mg Oral Daily Shaune Pollack, MD   75 mg at 11/24/15 0835  . enoxaparin (LOVENOX) injection 40 mg  40 mg Subcutaneous Q24H Shaune Pollack, MD   40 mg at 11/23/15 2104  . furosemide (LASIX) injection 40 mg  40 mg Intravenous BID Katharina Caper, MD   40 mg at 11/24/15 0837  . hydrALAZINE (APRESOLINE) injection 10 mg  10 mg Intravenous Q6H PRN Shaune PollackQing Chen, MD      . insulin aspart (novoLOG) injection 0-20 Units  0-20 Units Subcutaneous TID WC Katharina Caperima Vaickute, MD   4 Units at 11/24/15 0837  . insulin aspart (novoLOG) injection 0-5 Units  0-5 Units Subcutaneous QHS Katharina Caperima Vaickute, MD   3 Units at 11/23/15 2105  . insulin glargine (LANTUS) injection 25 Units  25 Units Subcutaneous QHS Katharina Caperima Vaickute, MD   25 Units at 11/23/15 2105  . ipratropium-albuterol (DUONEB) 0.5-2.5 (3) MG/3ML nebulizer solution 3 mL  3 mL Nebulization Q4H Katharina Caperima Vaick[place], MD   3 mL at 11/24/15 0839  . isosorbide mononitrate (IMDUR) 24 hr tablet 30 mg  30 mg Oral Daily Shaune PollackQing Chen, MD   30 mg at 11/24/15 0835  . levofloxacin (LEVAQUIN) tablet 750 mg  750 mg Oral Daily  Sherry RuffingCrystal G Scarpena, RPH   750 mg at 11/24/15 0836  . lisinopril (PRINIVIL,ZESTRIL) tablet 5 mg  5 mg Oral BID Katharina Caperima Vaickute, MD   5 mg at 11/24/15 0835  . methylPREDNISolone sodium succinate (SOLU-MEDROL) 125 mg/2 mL injection 60 mg  60 mg Intravenous Q24H Katharina Caperima Vaickute, MD   60 mg at 11/23/15 1253  . metoprolol tartrate (LOPRESSOR) tablet 25 mg  25 mg Oral BID Shaune PollackQing Chen, MD   25 mg at 11/24/15 0836  . mirtazapine (REMERON) tablet 15 mg  15 mg Oral QHS Shaune PollackQing Chen, MD   15 mg at 11/23/15 2106  . nicotine (NICODERM CQ - dosed in mg/24 hours) patch 21 mg  21 mg Transdermal Daily Shaune PollackQing Chen, MD   21 mg at 11/24/15 [id number]  . nitroGLYCERIN (NITROGLYN) 2 % ointment 0.5 inch  0.5 inch Topical 4 times per day Katharina Caperima Vaickute, MD   0.5 inch at 11/24/15 [id number]  . OLANZapine (ZYPREXA) tablet 10 mg  10 mg Oral QHS Shaune PollackQing Chen, MD   10 mg at 11/23/15 2106  . pantoprazole (PROTONIX) EC tablet 40 mg  40 mg Oral Daily Shaune PollackQing Chen, MD   40 mg at 11/24/15 0835  . sodium chloride 0.9 % injection 3 mL  3 mL Intravenous Q12H Shaune PollackQing Chen, MD   3 mL at 11/24/15 0844  . sodium chloride 0.9 % injection 3 mL  3 mL Intravenous PRN Shaune PollackQing Chen, MD      . tamsulosin [facility](FLOMAX) capsule 0.4 mg  0.4 mg Oral QHS Shaune PollackQing Chen, MD   0.4 mg at 11/23/15 2106  . valproic acid (DEPAKENE) 250 MG capsule 250 mg  250 mg Oral TID Shaune PollackQing Chen, MD   250 mg at 11/24/15 [id number]     Discharge Medications: Please see discharge summary for a list of discharge medications.  Relevant Imaging Results:  Relevant Lab Results:   Additional Information SSN:  [id number]  Dede QuerySarah Kaleem Sartwell, LCSW

## 2015-11-24 NOTE — Plan of Care (Signed)
Problem: Education: Goal: Ability to identify and alter actions that are detrimental to health will improve Outcome: Progressing Patient alert and oriented. VSS. No complaints of pain. Possible discharge to skilled nursing tomorrow. Up with 1 assist.

## 2015-11-24 NOTE — Progress Notes (Signed)
[facility] [place] [facility]   PATIENT NAME: Mario PenceMichael Eimer    MR#:  [id number]  DATE OF BIRTH:  1951/01/18  SUBJECTIVE:  CHIEF COMPLAINT:   Chief Complaint  Patient presents with  . Shortness of Breath   patient is 65 year old Caucasian male with past history significant for history of COPD, diabetes, hypertension, coronary artery disease who presents to the hospital with complaints of cough, shortness of breath, wheezing, fevers, chills and left-sided weakness, also left facial droop. He was admitted to the hospital for further evaluation and treatment. Chest x-ray  revealed pleural effusions and lower atelectasis bilaterally. CT of head was unremarkable. Brain MRI was also unremarkable, but chronic left temporal lobe infarct. Patient is comfortable today, feels better. Able to go to rehabilitation facility which, however, he is not accepting patients today. Very likely discharge tomorrow    Review of Systems  Constitutional: Positive for malaise/fatigue. Negative for fever, chills and weight loss.  HENT: Negative for congestion.   Eyes: Negative for blurred vision and double vision.  Respiratory: Positive for cough, sputum production, shortness of breath and wheezing.   Cardiovascular: Negative for chest pain, palpitations, orthopnea, leg swelling and PND.  Gastrointestinal: Negative for nausea, vomiting, abdominal pain, diarrhea, constipation and blood in stool.  Genitourinary: Negative for dysuria, urgency, frequency and hematuria.  Musculoskeletal: Negative for falls.  Neurological: Positive for focal weakness. Negative for dizziness, tremors and headaches.  Endo/Heme/Allergies: Does not bruise/bleed easily.  Psychiatric/Behavioral: Negative for depression. The patient does not have insomnia.     VITAL SIGNS: Blood pressure 152/50, pulse 71, temperature 97.8 F (36.6 C), temperature source Oral, resp. rate 18, height 5\' 8"  (1.727 m), weight 84.324  kg (185 lb 14.4 oz), SpO2 93 %.  PHYSICAL EXAMINATION:   GENERAL:  65 y.o.-year-old patient lying in the bed with no acute distress. Left facial droop. Slurring speech EYES: Pupils equal, round, reactive to light and accommodation. No scleral icterus. Extraocular muscles intact.  HEENT: Head atraumatic, normocephalic. Oropharynx and nasopharynx clear.  NECK:  Supple, no jugular venous distention. No thyroid enlargement, no tenderness.  LUNGS: Some diminished breath sounds bilaterally, no wheezing, rales,rhonchi or crepitation noted anteriorly  , not using accessory muscles of respiration.  CARDIOVASCULAR: S1, S2 normal. No murmurs, rubs, or gallops.  ABDOMEN: Soft, nontender, nondistended. Bowel sounds present. No organomegaly or mass.  EXTREMITIES: No pedal edema, cyanosis, or clubbing.  NEUROLOGIC: Cranial nerves left facial droop. Muscle strength 4/5 in left-sided extremities. Sensation intact. Gait not checked.  PSYCHIATRIC: The patient is somnolent , sleepy , not able to assess orientation .  SKIN: No obvious rash, lesion, or ulcer.   ORDERS/RESULTS REVIEWED:   CBC  Recent Labs Lab 11/19/15 1740 11/22/15 0305 11/23/15 0402 11/24/15 0749  WBC 13.2* 15.1*  --  10.0  HGB 11.5* 9.4* 10.3* 10.3*  HCT 36.2* 29.4*  --  31.5*  PLT 223 189  --  203  MCV 74.9* 74.4*  --  73.9*  MCH 23.7* 23.8*  --  24.2*  MCHC 31.6* 32.1  --  32.7  RDW 19.3* 18.8*  --  18.3*  LYMPHSABS 1.5  --   --   --   MONOABS 0.8  --   --   --   EOSABS 0.3  --   --   --   BASOSABS 0.1  --   --   --    ------------------------------------------------------------------------------------------------------------------  Chemistries   Recent Labs Lab 11/19/15 1740 11/20/15 1813 11/21/15 0522 11/21/15  2019 11/22/15 0305 11/24/15 0749  NA 141  --   --   --   --  139  K 4.7  --   --   --   --  4.0  CL 101  --   --   --   --  99*  CO2 32  --   --   --   --  34*  GLUCOSE 128* 458*  --  491*  --  200*  BUN  20  --   --   --   --  35*  CREATININE 0.81  --  0.66  --  0.74 0.81  CALCIUM 9.4  --   --   --   --  8.4*  AST 14*  --   --   --   --   --   ALT 10*  --   --   --   --   --   ALKPHOS 59  --   --   --   --   --   BILITOT 0.5  --   --   --   --   --    ------------------------------------------------------------------------------------------------------------------ estimated creatinine clearance is 94.9 mL/min (by C-G formula based on Cr of 0.81). ------------------------------------------------------------------------------------------------------------------ No results for input(s): TSH, T4TOTAL, T3FREE, THYROIDAB in the last 72 hours.  Invalid input(s): FREET3  Cardiac Enzymes  Recent Labs Lab 11/19/15 2223 11/20/15 0155 11/20/15 0815  TROPONINI <0.03 <0.03 <0.03   ------------------------------------------------------------------------------------------------------------------ Invalid input(s): POCBNP ---------------------------------------------------------------------------------------------------------------  RADIOLOGY: US Carotid Bilateral  11/23/2015  CLINICAL DATA:  Patient with history of stroke. Left arm and leg weakness. Left facial droop for 2 weeks. EXAM: BILATERAL CAROTID DUPLEX ULTRASOUND TECHNIQUE: Wallace Cullens scale imaging, color Doppler and duplex ultrasound were performed of bilateral carotid and vertebral arteries in the neck. COMPARISON:  None. FINDINGS: Criteria: Quantification of carotid stenosis is based on velocity parameters that correlate the residual internal carotid diameter with NASCET-based stenosis levels, using the diameter of the distal internal carotid lumen as the denominator for stenosis measurement. The following velocity measurements were obtained: RIGHT ICA:  100 cm/sec CCA:  102 cm/sec SYSTOLIC ICA/CCA RATIO:  1.0 DIASTOLIC ICA/CCA RATIO:  0.7 ECA:  207 cm/sec LEFT ICA:  89 cm/sec CCA:  104 cm/sec SYSTOLIC ICA/CCA RATIO:  0.9 DIASTOLIC ICA/CCA RATIO:   1.9 ECA:  312 cm/sec RIGHT CAROTID ARTERY: There is small amount of atherosclerotic plaque demonstrated within the right common carotid artery. Mild atherosclerotic plaque within the right carotid bulb as well as the proximal portion of the right internal carotid artery. RIGHT VERTEBRAL ARTERY:  Antegrade flow. LEFT CAROTID ARTERY: There is a small amount of atherosclerotic plaque within the carotid bulb as well as the proximal portion of the internal carotid artery. LEFT VERTEBRAL ARTERY:  Antegrade flow. IMPRESSION: No sonographic evidence to suggest hemodynamically significant stenosis within the bilateral internal carotid arteries. Electronically Signed   By: Annia Belt M.D.   On: 11/23/2015 16:06    EKG:  Orders placed or performed during the hospital encounter of 11/19/15  . EKG 12-Lead  . EKG 12-Lead    ASSESSMENT AND PLAN:  Principal Problem:   COPD exacerbation (HCC) Active Problems:   Pneumonia   Acute respiratory failure with hypoxia (HCC)   Emphysema lung (HCC)   CVA (cerebral infarction)   Slurring of speech   Tobacco abuse   Essential hypertension   Generalized weakness 1. Acute respiratory failure with hypoxia due to COPD exacerbation due to pneumonia, weaned off oxygen  therapy, now on room air, CT angiogram showed no PE, but pneumonia 2. COPD exacerbation due to pneumonia , continue steroids, DuoNeb ,  improved clinically, but O2 sats are low on exertion, negative CT angiogram for PE, continue oxygen as needed 3. Bilateral basilar pneumonia, unable to obtain sputum cultures.   Aspiration pneumonitis is still a possibility, SLP did not recommend no diet adjustment, but soft diet. Patient is to continue levofloxacin, clinically has improved 3. left-sided weakness, no stroke on MRI, although old left-sided stroke was noted  Continue patient on aspirin therapy as well as Lipitor  , Plavix ,  supportive therapy, will be discharged to skilled nursing facility for rehabilitation.  Carotid ultrasound was unremarkable. Echocardiogram showed grade 1 diastolic dysfunction, mild MR and normal ejection fraction.  4. Slurring speech, appreciate speech therapist evaluation, no adjustment was recommended. However, patient is now on soft diet 5. Tobacco abuse. Continue nicotine replacement therapy 6. Acute pulmonary edema due to CHF, getting echocardiogram, patient was managed on Lasix initially, improved clinically , now off Lasix . Continue Zestril, advance dose 7. Essential hypertension, poorly controlled, continue bisoprolol as well as metoprolol, advanced lisinopril, follow in the morning 8. Generalized weakness, patient was seen by physical therapist and home health physical therapy was recommended. Patient's wife, however, was concerned about him returning back home due to significant weakness , patient was agreeable to go to rehabilitation facility where he is going to be discharged tomorrow     Management plans discussed with the patient, family and they are in agreement.   DRUG ALLERGIES: No Known Allergies  CODE STATUS:     Code Status Orders        Start     Ordered   11/19/15 2030  Do not attempt resuscitation (DNR)   Continuous    Question Answer Comment  In the event of cardiac or respiratory ARREST Do not call a "code blue"   In the event of cardiac or respiratory ARREST Do not perform Intubation, CPR, defibrillation or ACLS   In the event of cardiac or respiratory ARREST Use medication by any route, position, wound care, and other measures to relive pain and suffering. May use oxygen, suction and manual treatment of airway obstruction as needed for comfort.      11/19/15 2029      TOTAL TIME TAKING CARE OF THIS PATIENT: 35 minutes.    Katharina Caper M.D on 11/24/2015 at 4:48 PM  Between 7am to 6pm - Pager - [phone]  After 6pm go to [email or web address] - password [email or web address] [facility]  [place] [facility]  Office  [phone]  CC: Primary care  physician; No PCP Per Patient

## 2015-11-24 NOTE — Progress Notes (Signed)
Feeding Team Note: reviewed chart notes; consulted NSG then met w/ pt and family. Pt and family denied any s/s of dysphagia and stated he was tolerating his oral diet well. No decline in respiratory status per MD notes. Family stated pt's meds are being adjusted and it made him a "little loopy" yesterday but he seemed "better" today. Encouraged general aspiration precautions w/ any oral intake; all agreed. No further skilled ST services indicated at this time as pt appears to be at his baseline. NSG to reconsult if any change in status while admitted. Pt agreed.

## 2015-11-25 ENCOUNTER — Telehealth: Payer: Self-pay

## 2015-11-25 LAB — GLUCOSE, CAPILLARY
GLUCOSE-CAPILLARY: 195 mg/dL — AB (ref 65–99)
Glucose-Capillary: 224 mg/dL — ABNORMAL HIGH (ref 65–99)

## 2015-11-25 MED ORDER — LISINOPRIL 20 MG PO TABS: 20.0000 mg | ORAL_TABLET | Freq: Two times a day (BID) | ORAL | Status: AC

## 2015-11-25 MED ORDER — ASPIRIN EC 81 MG PO TBEC: 81.0000 mg | DELAYED_RELEASE_TABLET | Freq: Every day | ORAL | Status: AC

## 2015-11-25 NOTE — Telephone Encounter (Signed)
appt scheduled w/[facility]. Nothing further needed.

## 2015-11-25 NOTE — Telephone Encounter (Signed)
Needs 3 day f/u. Please call and advise of appt

## 2015-11-25 NOTE — Clinical Social Work Note (Signed)
Pt is ready for discharge to [facility]. Facility has received discharge information and is ready to admit pt. Pt and ex-wife are aware and agreeable to discharge plan. RN to call report and EMS will provide transportation. CSW is signing off.   Dede QuerySarah Gurjit Loconte, MSW, LCSW Clinical Social Worker  [phone]

## 2015-11-25 NOTE — Progress Notes (Signed)
Report called to [facility] at [facility]. Pt transported to facility via car with caregiver

## 2015-11-25 NOTE — Discharge Summary (Signed)
[facility] - [place] at [facility]   PATIENT NAME: Benjaman Artman    MR#:  [id number]  DATE OF BIRTH:  Jan 20, 1951  DATE OF ADMISSION:  11/19/2015 ADMITTING PHYSICIAN: Shaune Pollack, MD  DATE OF DISCHARGE: 11/25/15  PRIMARY CARE PHYSICIAN: No PCP Per Patient    ADMISSION DIAGNOSIS:  Community acquired pneumonia [J18.9] COPD exacerbation (HCC) [J44.1] Left-sided weakness [M62.89]  DISCHARGE DIAGNOSIS:  Principal Problem:   COPD exacerbation (HCC) Active Problems:   Pneumonia   Acute respiratory failure with hypoxia (HCC)   Emphysema lung (HCC)   CVA (cerebral infarction)   Slurring of speech   Tobacco abuse   Essential hypertension   Generalized weakness   SECONDARY DIAGNOSIS:   Past Medical History  Diagnosis Date  . Diabetes mellitus without complication (HCC)   . Hypertension   . COPD (chronic obstructive pulmonary disease) (HCC)   . MI (myocardial infarction) [facility])     HOSPITAL COURSE:   65 year old male with past medical history significant for COPD, smoking, CHF, hypertension presents to the hospital secondary to difficulty breathing.  #1 acute on chronic COPD exacerbation-presented with acute respiratory failure with hypoxia. -Currently weaned off oxygen, has been on room air. CT angiogram with no pulmonary emboli. -Has bibasilar pneumonia. Continue steroid taper. Also nebulizers. -Continue antibiotics. Patient will be discharged on Levaquin. Aspiration has been ruled out.  #2 subacute CVA-left-sided weakness and left facial droop. MRI showing old left-sided stroke. -Continue Lipitor and statin. -Carotid ultrasound unremarkable. Echocardiogram with no source of emboli. -Physical therapy recommended rehabilitation.  #3 acute diastolic CHF exacerbation-echo with normal EF, diastolic dysfunction noted. -Use Lasix as needed.  #4 tobacco use disorder-no nicotine patch   #5 hypertension-continue home medications. Patient on metoprolol,  lisinopril, Imdur and Lasix  #6 diabetes mellitus-on Januvia, metformin    discharge to rehabilitation today.  DISCHARGE CONDITIONS:   Guarded  CONSULTS OBTAINED:  Treatment Team:  Laurier Nancy, MD  DRUG ALLERGIES:  No Known Allergies  DISCHARGE MEDICATIONS:   Current Discharge Medication List    START taking these medications   Details  aspirin EC 81 MG tablet Take 1 tablet (81 mg total) by mouth daily. Qty: 30 tablet, Refills: 2    chlorpheniramine-HYDROcodone (TUSSIONEX PENNKINETIC ER) 10-8 MG/5ML SUER Take 5 mLs by mouth every 12 (twelve) hours as needed for cough. Qty: 140 mL, Refills: 0    ipratropium-albuterol (DUONEB) 0.5-2.5 (3) MG/3ML SOLN Take 3 mLs by nebulization every 4 (four) hours as needed. Qty: 360 mL, Refills: 5    levofloxacin (LEVAQUIN) 750 MG tablet Take 1 tablet (750 mg total) by mouth daily. Qty: 5 tablet, Refills: 0    lisinopril (PRINIVIL,ZESTRIL) 20 MG tablet Take 1 tablet (20 mg total) by mouth 2 (two) times daily. Qty: 60 tablet, Refills: 2    nicotine (NICODERM CQ - DOSED IN MG/24 HOURS) 21 mg/24hr patch Place 1 patch (21 mg total) onto the skin daily. Qty: 28 patch, Refills: 0    predniSONE (STERAPRED UNI-PAK 21 TAB) 10 MG (21) TBPK tablet Take 1 tablet (10 mg total) by mouth daily. Please take 6 pills on the day 1, then taper by one pill daily until finished Qty: 21 tablet, Refills: 0      CONTINUE these medications which have CHANGED   Details  insulin glargine (LANTUS) 100 UNIT/ML injection Inject 0.25 mLs (25 Units total) into the skin at bedtime. Qty: 10 mL, Refills: 11      CONTINUE these medications which have NOT CHANGED  Details  albuterol (PROVENTIL HFA;VENTOLIN HFA) 108 (90 Base) MCG/ACT inhaler Inhale 1-2 puffs into the lungs every 4 (four) hours as needed for wheezing or shortness of breath.     atorvastatin (LIPITOR) 80 MG tablet Take 80 mg by mouth at bedtime.    bisoprolol (ZEBETA) 5 MG tablet Take 5 mg by  mouth daily.    clopidogrel (PLAVIX) 75 MG tablet Take 75 mg by mouth daily.    furosemide (LASIX) 40 MG tablet Take 40 mg by mouth daily.    isosorbide mononitrate (IMDUR) 30 MG 24 hr tablet Take 30 mg by mouth daily.    metFORMIN (GLUCOPHAGE) 500 MG tablet Take 500 mg by mouth 2 (two) times daily.    metoprolol tartrate (LOPRESSOR) 25 MG tablet Take 25 mg by mouth 2 (two) times daily.    mirtazapine (REMERON) 15 MG tablet Take 15 mg by mouth at bedtime.    OLANZapine (ZYPREXA) 10 MG tablet Take 10 mg by mouth at bedtime.    omeprazole (PRILOSEC) 40 MG capsule Take 40 mg by mouth daily.    sitaGLIPtin (JANUVIA) 100 MG tablet Take 100 mg by mouth daily.    SYMBICORT 160-4.5 MCG/ACT inhaler Inhale 2 puffs into the lungs 2 (two) times daily.     tamsulosin (FLOMAX) 0.4 MG CAPS capsule Take 0.4 mg by mouth at bedtime.     valproic acid (DEPAKENE) 250 MG capsule Take 250 mg by mouth 3 (three) times daily.          DISCHARGE INSTRUCTIONS:   1. PCP follow-up in 2 weeks 2. Pulmonary follow-up in 1-2 weeks   If you experience worsening of your admission symptoms, develop shortness of breath, life threatening emergency, suicidal or homicidal thoughts you must seek medical attention immediately by calling 911 or calling your MD immediately  if symptoms less severe.  You Must read complete instructions/literature along with all the possible adverse reactions/side effects for all the Medicines you take and that have been prescribed to you. Take any new Medicines after you have completely understood and accept all the possible adverse reactions/side effects.   Please note  You were cared for by a hospitalist during your hospital stay. If you have any questions about your discharge medications or the care you received while you were in the hospital after you are discharged, you can call the unit and asked to speak with the hospitalist on call if the hospitalist that took care of you is  not available. Once you are discharged, your primary care physician will handle any further medical issues. Please note that NO REFILLS for any discharge medications will be authorized once you are discharged, as it is imperative that you return to your primary care physician (or establish a relationship with a primary care physician if you do not have one) for your aftercare needs so that they can reassess your need for medications and monitor your lab values.    Today   CHIEF COMPLAINT:   Chief Complaint  Patient presents with  . Shortness of Breath    VITAL SIGNS:  Blood pressure 151/62, pulse 63, temperature 98.3 F (36.8 C), temperature source Oral, resp. rate 19, height 5\' 8"  (1.727 m), weight 84.324 kg (185 lb 14.4 oz), SpO2 91 %.  I/O:   Intake/Output Summary (Last 24 hours) at 11/25/15 1332 Last data filed at 11/25/15 0900  Gross per 24 hour  Intake    660 ml  Output   2325 ml  Net  -1665 ml  PHYSICAL EXAMINATION:   Physical Exam  GENERAL:  65 y.o.-year-old patient lying in the bed with no acute distress. Left facial droop noted. Some slurring in speech noted. EYES: Pupils equal, round, reactive to light and accommodation. No scleral icterus. Extraocular muscles intact.  HEENT: Head atraumatic, normocephalic. Oropharynx and nasopharynx clear.  NECK:  Supple, no jugular venous distention. No thyroid enlargement, no tenderness.  LUNGS: Normal breath sounds bilaterally, no wheezing, rales,rhonchi or crepitation. No use of accessory muscles of respiration.  CARDIOVASCULAR: S1, S2 normal. No murmurs, rubs, or gallops.  ABDOMEN: Soft, non-tender, non-distended. Bowel sounds present. No organomegaly or mass.  EXTREMITIES: No pedal edema, cyanosis, or clubbing.  NEUROLOGIC: Cranial nerves II through XII are intact except left facial droop. Muscle strength 5/5 in all extremities except 4/5 in left upper extremity. Sensation intact. Gait not checked.  PSYCHIATRIC: The patient  is alert and oriented x 2-3.  SKIN: No obvious rash, lesion, or ulcer.   DATA REVIEW:   CBC  Recent Labs Lab 11/24/15 0749  WBC 10.0  HGB 10.3*  HCT 31.5*  PLT 203    Chemistries   Recent Labs Lab 11/19/15 1740  11/24/15 0749  NA 141  --  139  K 4.7  --  4.0  CL 101  --  99*  CO2 32  --  34*  GLUCOSE 128*  < > 200*  BUN 20  --  35*  CREATININE 0.81  < > 0.81  CALCIUM 9.4  --  8.4*  AST 14*  --   --   ALT 10*  --   --   ALKPHOS 59  --   --   BILITOT 0.5  --   --   < > = values in this interval not displayed.  Cardiac Enzymes  Recent Labs Lab 11/20/15 0815  TROPONINI <0.03    Microbiology Results  Results for orders placed or performed during the hospital encounter of 07/06/15  C difficile quick scan w PCR reflex     Status: None   Collection Time: 07/07/15  9:21 PM  Result Value Ref Range Status   C Diff antigen NEGATIVE NEGATIVE Final   C Diff toxin NEGATIVE NEGATIVE Final   C Diff interpretation Negative for C. difficile  Final    RADIOLOGY:  US Carotid Bilateral  11/23/2015  CLINICAL DATA:  Patient with history of stroke. Left arm and leg weakness. Left facial droop for 2 weeks. EXAM: BILATERAL CAROTID DUPLEX ULTRASOUND TECHNIQUE: Wallace Cullens scale imaging, color Doppler and duplex ultrasound were performed of bilateral carotid and vertebral arteries in the neck. COMPARISON:  None. FINDINGS: Criteria: Quantification of carotid stenosis is based on velocity parameters that correlate the residual internal carotid diameter with NASCET-based stenosis levels, using the diameter of the distal internal carotid lumen as the denominator for stenosis measurement. The following velocity measurements were obtained: RIGHT ICA:  100 cm/sec CCA:  102 cm/sec SYSTOLIC ICA/CCA RATIO:  1.0 DIASTOLIC ICA/CCA RATIO:  0.7 ECA:  207 cm/sec LEFT ICA:  89 cm/sec CCA:  104 cm/sec SYSTOLIC ICA/CCA RATIO:  0.9 DIASTOLIC ICA/CCA RATIO:  1.9 ECA:  312 cm/sec RIGHT CAROTID ARTERY: There is small  amount of atherosclerotic plaque demonstrated within the right common carotid artery. Mild atherosclerotic plaque within the right carotid bulb as well as the proximal portion of the right internal carotid artery. RIGHT VERTEBRAL ARTERY:  Antegrade flow. LEFT CAROTID ARTERY: There is a small amount of atherosclerotic plaque within the carotid bulb as well as the proximal  portion of the internal carotid artery. LEFT VERTEBRAL ARTERY:  Antegrade flow. IMPRESSION: No sonographic evidence to suggest hemodynamically significant stenosis within the bilateral internal carotid arteries. Electronically Signed   By: Annia Belt M.D.   On: 11/23/2015 16:06    EKG:   Orders placed or performed during the hospital encounter of 11/19/15  . EKG 12-Lead  . EKG 12-Lead      Management plans discussed with the patient, family and they are in agreement.  CODE STATUS: DNR    Code Status Orders        Start     Ordered   11/19/15 2030  Do not attempt resuscitation (DNR)   Continuous    Question Answer Comment  In the event of cardiac or respiratory ARREST Do not call a "code blue"   In the event of cardiac or respiratory ARREST Do not perform Intubation, CPR, defibrillation or ACLS   In the event of cardiac or respiratory ARREST Use medication by any route, position, wound care, and other measures to relive pain and suffering. May use oxygen, suction and manual treatment of airway obstruction as needed for comfort.      11/19/15 2029      TOTAL TIME TAKING CARE OF THIS PATIENT: 38 minutes.    Tom Ragsdale M.D on 11/25/2015 at 1:32 PM  Between 7am to 6pm - Pager - [phone]  After 6pm go to [email or web address] - password [email or web address] [facility]  [place] [facility]  Office  [phone]  CC: Primary care physician; No PCP Per Patient

## 2015-11-25 NOTE — Clinical Social Work Placement (Signed)
   CLINICAL SOCIAL WORK PLACEMENT  NOTE  Date:  11/25/2015  Patient Details  Name: Mario Peterson MRN: [id number] Date of Birth: October 05, 1951  Clinical Social Work is seeking post-discharge placement for this patient at the Skilled  Nursing Facility level of care (*CSW will initial, date and re-position this form in  chart as items are completed):  Yes   Patient/family provided with [facility] Clinical Social Work Department's list of facilities offering this level of care within the geographic area requested by the patient (or if unable, by the patient's family).  Yes   Patient/family informed of their freedom to choose among providers that offer the needed level of care, that participate in Medicare, Medicaid or managed care program needed by the patient, have an available bed and are willing to accept the patient.  Yes   Patient/family informed of [facility]'s ownership interest in [facility] and [facility], as well as of the fact that they are under no obligation to receive care at these facilities.  PASRR submitted to EDS on 11/23/15     PASRR number received on 11/25/15     Existing PASRR number confirmed on       FL2 transmitted to all facilities in geographic area requested by pt/family on 11/23/15     FL2 transmitted to all facilities within larger geographic area on       Patient informed that his/her managed care company has contracts with or will negotiate with certain facilities, including the following:        Yes   Patient/family informed of bed offers received.  Patient chooses bed at [facility]     Physician recommends and patient chooses bed at  [facility](SNF)    [facility] on 11/25/15.  Patient [facility]     Patient family notified on 11/25/15 of transfer.  Name of family member notified:  Gardiner RamusLillian, ex-wife     PHYSICIAN       Additional Comment:     _______________________________________________ Dede QuerySarah Mardelle Pandolfi, LCSW 11/25/2015, 3:21 PM

## 2015-11-25 NOTE — Progress Notes (Signed)
Initial Nutrition Assessment   INTERVENTION:   Meals and Snacks: Continue to cater to patient preferences on Soft Diet/Carb Modified order, SLP following   NUTRITION DIAGNOSIS:   No nutrition diagnosis at this time  GOAL:   Patient will meet greater than or equal to 90% of their needs  MONITOR:    (Energy Intake, Glucose Profile, Digestive System)  REASON FOR ASSESSMENT:   LOS    ASSESSMENT:    Pt admitted with COPD exacerbation and pna.  Past Medical History  Diagnosis Date  . Diabetes mellitus without complication (HCC)   . Hypertension   . COPD (chronic obstructive pulmonary disease) (HCC)   . MI (myocardial infarction) (HCC)      Diet Order:  DIET SOFT Room service appropriate?: Yes; Fluid consistency:: Thin Diet Carb Modified    Current Nutrition: Pt eating 100% of meals since admission upon chart review.   Scheduled Medications:  . aspirin  325 mg Oral Daily  . atorvastatin  80 mg Oral QHS  . bisoprolol  5 mg Oral Daily  . budesonide-formoterol  2 puff Inhalation BID  . clopidogrel  75 mg Oral Daily  . enoxaparin (LOVENOX) injection  40 mg Subcutaneous Q24H  . furosemide  40 mg Intravenous BID  . insulin aspart  0-20 Units Subcutaneous TID WC  . insulin aspart  0-5 Units Subcutaneous QHS  . insulin glargine  25 Units Subcutaneous QHS  . ipratropium-albuterol  3 mL Nebulization QID  . isosorbide mononitrate  30 mg Oral Daily  . levofloxacin  750 mg Oral Daily  . lisinopril  10 mg Oral BID  . methylPREDNISolone (SOLU-MEDROL) injection  60 mg Intravenous Q24H  . metoprolol tartrate  25 mg Oral BID  . mirtazapine  15 mg Oral QHS  . nicotine  21 mg Transdermal Daily  . nitroGLYCERIN  0.5 inch Topical 4 times per day  . OLANZapine  10 mg Oral QHS  . pantoprazole  40 mg Oral Daily  . sodium chloride  3 mL Intravenous Q12H  . tamsulosin  0.4 mg Oral QHS  . valproic acid  250 mg Oral TID    Electrolyte/Renal Profile and Glucose Profile:   Recent  Labs Lab 11/19/15 1740 11/20/15 1813 11/21/15 0522 11/21/15 2019 11/22/15 0305 11/24/15 0749  NA 141  --   --   --   --  139  K 4.7  --   --   --   --  4.0  CL 101  --   --   --   --  99*  CO2 32  --   --   --   --  34*  BUN 20  --   --   --   --  35*  CREATININE 0.81  --  0.66  --  0.74 0.81  CALCIUM 9.4  --   --   --   --  8.4*  GLUCOSE 128* 458*  --  491*  --  200*   Protein Profile:  Recent Labs Lab 11/19/15 1740  ALBUMIN 3.8    Gastrointestinal Profile: Last BM:  11/23/2015   Weight Change: Per [facility] encounters, pt with weight gain in the past 4 months   Height:   Ht Readings from Last 1 Encounters:  11/19/15 5\' 8"  (1.727 m)    Weight:   Wt Readings from Last 1 Encounters:  11/19/15 185 lb 14.4 oz (84.324 kg)     BMI:  Body mass index is 28.27 kg/(m^2).   EDUCATION  NEEDS:   No education needs identified at this time   [place], RD, LDN Pager [phone] Weekend/On-Call Pager [phone]

## 2015-11-25 NOTE — Progress Notes (Signed)
Inpatient Diabetes Program Recommendations  AACE/ADA: New Consensus Statement on Inpatient Glycemic Control (2015)  Target Ranges:  Prepandial:   less than 140 mg/dL      Peak postprandial:   less than 180 mg/dL (1-2 hours)      Critically ill patients:  140 - 180 mg/dL  Results for Mario Peterson, Mario Peterson (MRN [id number]) as of 11/25/2015 10:50  Ref. Range 11/24/2015 07:21 11/24/2015 11:25 11/24/2015 16:13 11/24/2015 21:20 11/25/2015 07:29  Glucose-Capillary Latest Ref Range: 65-99 mg/dL 409192 (H) 811289 (H) 914329 (H) 360 (H) 195 (H)   Review of Glycemic Control  Diabetes history: Type 2 Outpatient Diabetes medications: Lantus 20 units daily, Metformin 500 mg BID, Januvia 100 mg daily Current orders for Inpatient glycemic control: Lantus 25 units QHS, Novolog 0-20 units TID with meals, Novolog 0-5 units QHS  Inpatient Diabetes Program Recommendations: Insulin - Basal: If steroids are continued as ordered, please consider increasing Lantus to 27 units QHS. Insulin - Meal Coverage: Post prandial glucose is consistently elevated. If steroids are continued as ordered, please consider ordering Novolog 5 units TID with meals for meal coverage (in addition to Novolog correction scale).  Thanks, Orlando PennerMarie Alpheus Stiff, RN, MSN, CDE Diabetes Coordinator Inpatient Diabetes Program [phone] (Team Pager from 8am to 5pm) [phone] ([facility] office) [phone] [facility] office) [phone] [facility] office)

## 2015-11-25 NOTE — Progress Notes (Signed)
Patient alert and oriented, vss, on RA.   D/C today.

## 2015-11-27 ENCOUNTER — Telehealth: Payer: Self-pay | Admitting: *Deleted

## 2015-11-27 DIAGNOSIS — J189 Pneumonia, unspecified organism: Secondary | ICD-10-CM

## 2015-11-27 NOTE — Telephone Encounter (Signed)
LMOM for pt to call back to inform pt to get a CXR prior to [facility]. Order has been placed.

## 2015-11-27 NOTE — Telephone Encounter (Signed)
Spoke with pt's significant other to inform of CXR prior to appt. Nothing further needed.

## 2015-11-28 ENCOUNTER — Ambulatory Visit (INDEPENDENT_AMBULATORY_CARE_PROVIDER_SITE_OTHER): Payer: Medicare Other | Admitting: Internal Medicine

## 2015-11-28 ENCOUNTER — Encounter: Payer: Self-pay | Admitting: Internal Medicine

## 2015-11-28 ENCOUNTER — Ambulatory Visit
Admission: RE | Admit: 2015-11-28 | Discharge: 2015-11-28 | Disposition: A | Discharge status: Home / Self Care | Payer: No Typology Code available for payment source | Source: Ambulatory Visit | Attending: Internal Medicine | Admitting: Internal Medicine

## 2015-11-28 VITALS — BP 128/62 | HR 68 | Ht 67.0 in | Wt 180.0 lb

## 2015-11-28 DIAGNOSIS — J189 Pneumonia, unspecified organism: Secondary | ICD-10-CM | POA: Insufficient documentation

## 2015-11-28 DIAGNOSIS — J90 Pleural effusion, not elsewhere classified: Secondary | ICD-10-CM | POA: Diagnosis not present

## 2015-11-28 DIAGNOSIS — J449 Chronic obstructive pulmonary disease, unspecified: Secondary | ICD-10-CM

## 2015-11-28 MED ORDER — UMECLIDINIUM BROMIDE 62.5 MCG/INH IN AEPB
1.0000 | INHALATION_SPRAY | Freq: Every day | RESPIRATORY_TRACT | Status: DC
Start: 2015-11-28 — End: 2016-02-20

## 2015-11-28 MED ORDER — FLUTICASONE FUROATE-VILANTEROL 100-25 MCG/INH IN AEPB: 1.0000 | INHALATION_SPRAY | Freq: Every day | RESPIRATORY_TRACT | Status: AC

## 2015-11-28 MED ORDER — UMECLIDINIUM BROMIDE 62.5 MCG/INH IN AEPB: 1.0000 | INHALATION_SPRAY | Freq: Every day | RESPIRATORY_TRACT | Status: AC

## 2015-11-28 NOTE — Patient Instructions (Signed)
Chronic Obstructive Pulmonary Disease Chronic obstructive pulmonary disease (COPD) is a common lung condition in which airflow from the lungs is limited. COPD is a general term that can be used to describe many different lung problems that limit airflow, including both chronic bronchitis and emphysema. If you have COPD, your lung function will probably never return to normal, but there are measures you can take to improve lung function and make yourself feel better. CAUSES   Smoking (common).  Exposure to secondhand smoke.  Genetic problems.  Chronic inflammatory lung diseases or recurrent infections. SYMPTOMS  Shortness of breath, especially with physical activity.  Deep, persistent (chronic) cough with a large amount of thick mucus.  Wheezing.  Rapid breaths (tachypnea).  Gray or bluish discoloration (cyanosis) of the skin, especially in your fingers, toes, or lips.  Fatigue.  Weight loss.  Frequent infections or episodes when breathing symptoms become much worse (exacerbations).  Chest tightness. DIAGNOSIS Your health care provider will take a medical history and perform a physical examination to diagnose COPD. Additional tests for COPD may include:  Lung (pulmonary) function tests.  Chest X-ray.  CT scan.  Blood tests. TREATMENT  Treatment for COPD may include:  Inhaler and nebulizer medicines. These help manage the symptoms of COPD and make your breathing more comfortable.  Supplemental oxygen. Supplemental oxygen is only helpful if you have a low oxygen level in your blood.  Exercise and physical activity. These are beneficial for nearly all people with COPD.  Lung surgery or transplant.  Nutrition therapy to gain weight, if you are underweight.  Pulmonary rehabilitation. This may involve working with a team of health care providers and specialists, such as respiratory, occupational, and physical therapists. HOME CARE INSTRUCTIONS  Take all medicines  (inhaled or pills) as directed by your health care provider.  Avoid over-the-counter medicines or cough syrups that dry up your airway (such as antihistamines) and slow down the elimination of secretions unless instructed otherwise by your health care provider.  If you are a smoker, the most important thing that you can do is stop smoking. Continuing to smoke will cause further lung damage and breathing trouble. Ask your health care provider for help with quitting smoking. He or she can direct you to community resources or hospitals that provide support.  Avoid exposure to irritants such as smoke, chemicals, and fumes that aggravate your breathing.  Use oxygen therapy and pulmonary rehabilitation if directed by your health care provider. If you require home oxygen therapy, ask your health care provider whether you should purchase a pulse oximeter to measure your oxygen level at home.  Avoid contact with individuals who have a contagious illness.  Avoid extreme temperature and humidity changes.  Eat healthy foods. Eating smaller, more frequent meals and resting before meals may help you maintain your strength.  Stay active, but balance activity with periods of rest. Exercise and physical activity will help you maintain your ability to do things you want to do.  Preventing infection and hospitalization is very important when you have COPD. Make sure to receive all the vaccines your health care provider recommends, especially the pneumococcal and influenza vaccines. Ask your health care provider whether you need a pneumonia vaccine.  Learn and use relaxation techniques to manage stress.  Learn and use controlled breathing techniques as directed by your health care provider. Controlled breathing techniques include:  Pursed lip breathing. Start by breathing in (inhaling) through your nose for 1 second. Then, purse your lips as if you were   going to whistle and breathe out (exhale) through the  pursed lips for 2 seconds.  Diaphragmatic breathing. Start by putting one hand on your abdomen just above your waist. Inhale slowly through your nose. The hand on your abdomen should move out. Then purse your lips and exhale slowly. You should be able to feel the hand on your abdomen moving in as you exhale.  Learn and use controlled coughing to clear mucus from your lungs. Controlled coughing is a series of short, progressive coughs. The steps of controlled coughing are: 1. Lean your head slightly forward. 2. Breathe in deeply using diaphragmatic breathing. 3. Try to hold your breath for 3 seconds. 4. Keep your mouth slightly open while coughing twice. 5. Spit any mucus out into a tissue. 6. Rest and repeat the steps once or twice as needed. SEEK MEDICAL CARE IF:  You are coughing up more mucus than usual.  There is a change in the color or thickness of your mucus.  Your breathing is more labored than usual.  Your breathing is faster than usual. SEEK IMMEDIATE MEDICAL CARE IF:  You have shortness of breath while you are resting.  You have shortness of breath that prevents you from:  Being able to talk.  Performing your usual physical activities.  You have chest pain lasting longer than 5 minutes.  Your skin color is more cyanotic than usual.  You measure low oxygen saturations for longer than 5 minutes with a pulse oximeter. MAKE SURE YOU:  Understand these instructions.  Will watch your condition.  Will get help right away if you are not doing well or get worse.     STOP SYMBICORT TAKE DOUNEBS AND ALBUTEROL AS NEEDED START BREO/INCRUSE USE THAT DAILY NEED TO CHECK DIARRHEA FOR C DIFF INFECTION

## 2015-11-28 NOTE — Progress Notes (Signed)
[place] [facility] Consultation      Date: 11/28/2015,   MRN# [id number] Mario Peterson 01-06-1951 Code Status:  Hosp day:_0 @ Referring MD: _1 @     PCP:      AdmissionWeight: 180 lb (81.647 kg) (wt. per pt.)                 CurrentWeight: 180 lb (81.647 kg) (wt. per pt.) Idrissa Beville is a 65 y.o. old male seen in consultation for COPD and pneumonia.  Referring Physician:Dr Bridgett Larsson C CT CHIEF COMPLAINT:  Patient admitted last week for SOB,wheezing    HISTORY OF PRESENT ILLNESS   65 yo white male extensive smoking history approx 120 pack year Patient with progressive SOb and COugh with chills several days PTA on 11/18/25.  Patient has been dx with COPD in the past and takes symbicort and alb as needed Patient currently in rehab facility, patient has been wheelchair bound for several years Patient had CT chest 11/22/15 images reviewed 11/28/2015  Patient was treated for COPD exacerbation and given steroids and abx  Patient states that he has intermittent SOB at rest along with intermittent wheezing thats been going on for years Patient quit smoking 3 weeks due to cough and wheezing Other associated symptoms of bloody diarrhea last 24 hrs-patient still on abx ECHO 11/22/15  showed grade 1 diastolic dysfunction CT chest 11/22/15: emphysema changes, atelectasis/effusion left lower lobe ECHO  Other ass PAST MEDICAL HISTORY   Past Medical History  Diagnosis Date  . Diabetes mellitus without complication ([place])   . Hypertension   . COPD (chronic obstructive pulmonary disease) ([place])   . MI (myocardial infarction) [facility])      SURGICAL HISTORY   Past Surgical History  Procedure Laterality Date  . Cardiac surgery       FAMILY HISTORY   Family History  Problem Relation Age of Onset  . Diabetes Mother   . Lung cancer Father      SOCIAL HISTORY   Social History  Substance Use Topics  . Smoking status: Former Smoker -- 1.50 packs/day  for 53 years  . Smokeless tobacco: Never Used  . Alcohol Use: No     MEDICATIONS    Home Medication:  Current Outpatient Rx  Name  Route  Sig  Dispense  Refill  . ACCU-CHEK AVIVA PLUS test strip                 Dispense as written.   Marland Kitchen albuterol (PROVENTIL HFA;VENTOLIN HFA) 108 (90 Base) MCG/ACT inhaler   Inhalation   Inhale 1-2 puffs into the lungs every 4 (four) hours as needed for wheezing or shortness of breath.          Marland Kitchen aspirin EC 81 MG tablet   Oral   Take 1 tablet (81 mg total) by mouth daily.   30 tablet   2   . atorvastatin (LIPITOR) 80 MG tablet   Oral   Take 80 mg by mouth at bedtime.         . bisoprolol (ZEBETA) 5 MG tablet   Oral   Take 5 mg by mouth daily.         . Blood Glucose Monitoring Suppl (ACCU-CHEK AVIVA PLUS) w/Device KIT               . clopidogrel (PLAVIX) 75 MG tablet   Oral   Take 75 mg by mouth daily.         . furosemide (LASIX) 40 MG tablet  Oral   Take 40 mg by mouth daily.         . insulin glargine (LANTUS) 100 UNIT/ML injection   Subcutaneous   Inject 0.25 mLs (25 Units total) into the skin at bedtime.   10 mL   11   . ipratropium-albuterol (DUONEB) 0.5-2.5 (3) MG/3ML SOLN   Nebulization   Take 3 mLs by nebulization every 4 (four) hours as needed.   360 mL   5   . isosorbide mononitrate (IMDUR) 30 MG 24 hr tablet   Oral   Take 30 mg by mouth daily.         Marland Kitchen LANTUS SOLOSTAR 100 UNIT/ML Solostar Pen                 Dispense as written.   Marland Kitchen levofloxacin (LEVAQUIN) 750 MG tablet   Oral   Take 1 tablet (750 mg total) by mouth daily.   5 tablet   0   . lisinopril (PRINIVIL,ZESTRIL) 20 MG tablet   Oral   Take 1 tablet (20 mg total) by mouth 2 (two) times daily.   60 tablet   2   . metFORMIN (GLUCOPHAGE) 500 MG tablet   Oral   Take 500 mg by mouth 2 (two) times daily.         . metoprolol tartrate (LOPRESSOR) 25 MG tablet   Oral   Take 25 mg by mouth 2 (two) times daily.           . mirtazapine (REMERON) 15 MG tablet   Oral   Take 15 mg by mouth at bedtime.         . nicotine (NICODERM CQ - DOSED IN MG/24 HOURS) 21 mg/24hr patch   Transdermal   Place 1 patch (21 mg total) onto the skin daily.   28 patch   0   . OLANZapine (ZYPREXA) 10 MG tablet   Oral   Take 10 mg by mouth at bedtime.         Marland Kitchen omeprazole (PRILOSEC) 40 MG capsule   Oral   Take 40 mg by mouth daily.         . sitaGLIPtin (JANUVIA) 100 MG tablet   Oral   Take 100 mg by mouth daily.         . SYMBICORT 160-4.5 MCG/ACT inhaler   Inhalation   Inhale 2 puffs into the lungs 2 (two) times daily.            Dispense as written.   . tamsulosin (FLOMAX) 0.4 MG CAPS capsule   Oral   Take 0.4 mg by mouth at bedtime.          . valproic acid (DEPAKENE) 250 MG capsule   Oral   Take 250 mg by mouth 3 (three) times daily.          . chlorpheniramine-HYDROcodone (TUSSIONEX PENNKINETIC ER) 10-8 MG/5ML SUER   Oral   Take 5 mLs by mouth every 12 (twelve) hours as needed for cough. Patient not taking: Reported on 11/28/2015   140 mL   0   . Fluticasone Furoate-Vilanterol 100-25 MCG/INH AEPB   Inhalation   Inhale 1 puff into the lungs daily.   60 each   5   . Umeclidinium Bromide (INCRUSE ELLIPTA) 62.5 MCG/INH AEPB   Inhalation   Inhale 1 puff into the lungs daily.   30 each   5     Current Medication:  Current outpatient prescriptions:  .  ACCU-CHEK AVIVA PLUS  test strip, , Disp: , Rfl:  .  albuterol (PROVENTIL HFA;VENTOLIN HFA) 108 (90 Base) MCG/ACT inhaler, Inhale 1-2 puffs into the lungs every 4 (four) hours as needed for wheezing or shortness of breath. , Disp: , Rfl:  .  aspirin EC 81 MG tablet, Take 1 tablet (81 mg total) by mouth daily., Disp: 30 tablet, Rfl: 2 .  atorvastatin (LIPITOR) 80 MG tablet, Take 80 mg by mouth at bedtime., Disp: , Rfl:  .  bisoprolol (ZEBETA) 5 MG tablet, Take 5 mg by mouth daily., Disp: , Rfl:  .  Blood Glucose Monitoring Suppl  (ACCU-CHEK AVIVA PLUS) w/Device KIT, , Disp: , Rfl:  .  clopidogrel (PLAVIX) 75 MG tablet, Take 75 mg by mouth daily., Disp: , Rfl:  .  furosemide (LASIX) 40 MG tablet, Take 40 mg by mouth daily., Disp: , Rfl:  .  insulin glargine (LANTUS) 100 UNIT/ML injection, Inject 0.25 mLs (25 Units total) into the skin at bedtime., Disp: 10 mL, Rfl: 11 .  ipratropium-albuterol (DUONEB) 0.5-2.5 (3) MG/3ML SOLN, Take 3 mLs by nebulization every 4 (four) hours as needed., Disp: 360 mL, Rfl: 5 .  isosorbide mononitrate (IMDUR) 30 MG 24 hr tablet, Take 30 mg by mouth daily., Disp: , Rfl:  .  LANTUS SOLOSTAR 100 UNIT/ML Solostar Pen, , Disp: , Rfl:  .  levofloxacin (LEVAQUIN) 750 MG tablet, Take 1 tablet (750 mg total) by mouth daily., Disp: 5 tablet, Rfl: 0 .  lisinopril (PRINIVIL,ZESTRIL) 20 MG tablet, Take 1 tablet (20 mg total) by mouth 2 (two) times daily., Disp: 60 tablet, Rfl: 2 .  metFORMIN (GLUCOPHAGE) 500 MG tablet, Take 500 mg by mouth 2 (two) times daily., Disp: , Rfl:  .  metoprolol tartrate (LOPRESSOR) 25 MG tablet, Take 25 mg by mouth 2 (two) times daily., Disp: , Rfl:  .  mirtazapine (REMERON) 15 MG tablet, Take 15 mg by mouth at bedtime., Disp: , Rfl:  .  nicotine (NICODERM CQ - DOSED IN MG/24 HOURS) 21 mg/24hr patch, Place 1 patch (21 mg total) onto the skin daily., Disp: 28 patch, Rfl: 0 .  OLANZapine (ZYPREXA) 10 MG tablet, Take 10 mg by mouth at bedtime., Disp: , Rfl:  .  omeprazole (PRILOSEC) 40 MG capsule, Take 40 mg by mouth daily., Disp: , Rfl:  .  sitaGLIPtin (JANUVIA) 100 MG tablet, Take 100 mg by mouth daily., Disp: , Rfl:  .  SYMBICORT 160-4.5 MCG/ACT inhaler, Inhale 2 puffs into the lungs 2 (two) times daily. , Disp: , Rfl:  .  tamsulosin (FLOMAX) 0.4 MG CAPS capsule, Take 0.4 mg by mouth at bedtime. , Disp: , Rfl:  .  valproic acid (DEPAKENE) 250 MG capsule, Take 250 mg by mouth 3 (three) times daily. , Disp: , Rfl:  .  chlorpheniramine-HYDROcodone (TUSSIONEX PENNKINETIC ER) 10-8  MG/5ML SUER, Take 5 mLs by mouth every 12 (twelve) hours as needed for cough. (Patient not taking: Reported on 11/28/2015), Disp: 140 mL, Rfl: 0 .  Fluticasone Furoate-Vilanterol 100-25 MCG/INH AEPB, Inhale 1 puff into the lungs daily., Disp: 60 each, Rfl: 5 .  Umeclidinium Bromide (INCRUSE ELLIPTA) 62.5 MCG/INH AEPB, Inhale 1 puff into the lungs daily., Disp: 30 each, Rfl: 5    ALLERGIES   Review of patient's allergies indicates no known allergies.     REVIEW OF SYSTEMS   Review of Systems  Constitutional: Negative for fever, chills, weight loss and malaise/fatigue.  HENT: Negative for congestion.   Respiratory: Positive for shortness of breath and  wheezing. Negative for cough, hemoptysis and sputum production.   Cardiovascular: Negative for chest pain, palpitations, orthopnea and leg swelling.  Gastrointestinal: Positive for diarrhea. Negative for heartburn, nausea, vomiting and abdominal pain.  Musculoskeletal: Positive for joint pain.  Skin: Negative for rash.  Neurological: Negative for dizziness and headaches.  Endo/Heme/Allergies: Does not bruise/bleed easily.  Psychiatric/Behavioral: Negative for depression. The patient is not nervous/anxious.   All other systems reviewed and are negative.    VS: BP 128/62 mmHg  Pulse 68  Ht 5' 7" (1.702 m)  Wt 180 lb (81.647 kg)  BMI 28.19 kg/m2  SpO2 93%     PHYSICAL EXAM  Physical Exam  Constitutional: He is oriented to person, place, and time. He appears well-developed and well-nourished. No distress.  HENT:  Head: Normocephalic and atraumatic.  Mouth/Throat: No oropharyngeal exudate.  Eyes: EOM are normal. Pupils are equal, round, and reactive to light. No scleral icterus.  Neck: Normal range of motion. Neck supple.  Cardiovascular: Normal rate, regular rhythm and normal heart sounds.   No murmur heard. Pulmonary/Chest: No stridor. No respiratory distress. He has no wheezes.  Abdominal: Soft. Bowel sounds are normal. He  exhibits no distension. There is no tenderness. There is no rebound.  Musculoskeletal: Normal range of motion. He exhibits no edema.  Neurological: He is alert and oriented to person, place, and time. He displays normal reflexes. Coordination normal.  Skin: Skin is warm. He is not diaphoretic.  Psychiatric: He has a normal mood and affect.          IMAGING    Dg Chest 2 View  11/28/2015  CLINICAL DATA:  Clinical diagnosis of pneumonia ; history of COPD, previous MI, and diabetes. Former smoker. EXAM: CHEST  2 VIEW COMPARISON:  Portable chest x-ray of November 19, 2015 and CT scan of the chest of November 22, 2015 as well as portable chest x-ray of July 06, 2015 FINDINGS: The right lung is adequately inflated. There remains a small right pleural effusion. On the left there is persistent atelectasis or pneumonia in the lower lobe with a smaller moderate-sized pleural effusion. There are post median sternotomy and internal mammary artery dissection changes. The heart is normal in size. The pulmonary vascularity is normal. The mediastinum is normal in width. The bony thorax is unremarkable. IMPRESSION: Chronic left pleural effusion now all back to baseline of August 2016. New small right pleural effusion with small amount of right basilar atelectasis. Electronically Signed   By: David  Martinique M.D.   On: 11/28/2015 09:55   Ct Head Wo Contrast  11/19/2015  CLINICAL DATA:  Left-sided weakness for 2 weeks with left-sided facial droop EXAM: CT HEAD WITHOUT CONTRAST TECHNIQUE: Contiguous axial images were obtained from the base of the skull through the vertex without intravenous contrast. COMPARISON:  None. FINDINGS: Bony structures are within normal limits. Some mild motion artifact is noted. Diffuse atrophic changes are seen. Mild chronic white matter ischemic changes noted. No findings to suggest acute hemorrhage, acute infarction or space-occupying mass lesion are noted. IMPRESSION: Chronic atrophic  and ischemic changes without acute abnormality. Electronically Signed   By: Inez Catalina M.D.   On: 11/19/2015 17:55   Ct Angio Chest Pe W/cm &/or Wo Cm  11/22/2015  CLINICAL DATA:  Initial encounter for several month history of cough and now with hypoxia EXAM: CT ANGIOGRAPHY CHEST WITH CONTRAST TECHNIQUE: Multidetector CT imaging of the chest was performed using the standard protocol during bolus administration of intravenous contrast. Multiplanar CT image  reconstructions and MIPs were obtained to evaluate the vascular anatomy. CONTRAST:  171m OMNIPAQUE IOHEXOL 350 MG/ML SOLN COMPARISON:  None. FINDINGS: Mediastinum / Lymph Nodes: There is no axillary lymphadenopathy. 1.8 cm right thyroid nodule associated with other smaller nodules in the right thyroid gland. Scattered small lymph nodes are seen in the mediastinum. 14 mm short axis right hilar lymph node identified. Heart size is enlarged. Patient is status post CABG. The esophagus has normal imaging features. No evidence for filling defect in the opacified pulmonary arteries to suggest the presence of an acute pulmonary embolus. No evidence for dissection flap in the thoracic aorta. Lungs / Pleura: Changes of emphysema noted bilaterally. Bilateral lower lobe collapse/ consolidation noted dependently, left greater than right. Small left greater than right pleural effusions are associated. Upper Abdomen:  Unremarkable. MSK / Soft Tissues: Bone windows reveal no worrisome lytic or sclerotic osseous lesions. Review of the MIP images confirms the above findings. IMPRESSION: 1. No CT evidence for acute pulmonary embolus. 2. Bilateral lower lobe collapse/consolidation with small bilateral pleural effusions 3. Emphysema. Electronically Signed   By: EMisty StanleyM.D.   On: 11/22/2015 13:20   Mr Brain Wo Contrast  11/20/2015  CLINICAL DATA:  Left arm and leg weakness and left facial droop for 2 weeks. EXAM: MRI HEAD WITHOUT CONTRAST TECHNIQUE: Multiplanar,  multiecho pulse sequences of the brain and surrounding structures were obtained without intravenous contrast. COMPARISON:  Head CT 11/19/2015 FINDINGS: There is no evidence of acute infarct, intracranial hemorrhage, mass, midline shift, or extra-axial fluid collection. There is moderate cerebral atrophy, advanced for age. A small, chronic posterior left temporal lobe transcortical infarct is noted. T2 hyperintensities in the periventricular and subcortical cerebral white matter bilaterally are nonspecific but compatible with mild chronic small vessel ischemic disease. Orbits are unremarkable. Mild mucosal thickening is noted throughout the paranasal sinuses. There is a trace right mastoid effusion. Major intracranial vascular flow voids are preserved. IMPRESSION: 1. No acute intracranial abnormality. 2. Chronic left temporal lobe infarct. 3. Mild chronic small vessel ischemic disease. 4. Age advanced cerebral atrophy. Electronically Signed   By: ALogan BoresM.D.   On: 11/20/2015 13:53   UKoreaCarotid Bilateral  11/23/2015  CLINICAL DATA:  Patient with history of stroke. Left arm and leg weakness. Left facial droop for 2 weeks. EXAM: BILATERAL CAROTID DUPLEX ULTRASOUND TECHNIQUE: GPearline Cablesscale imaging, color Doppler and duplex ultrasound were performed of bilateral carotid and vertebral arteries in the neck. COMPARISON:  None. FINDINGS: Criteria: Quantification of carotid stenosis is based on velocity parameters that correlate the residual internal carotid diameter with NASCET-based stenosis levels, using the diameter of the distal internal carotid lumen as the denominator for stenosis measurement. The following velocity measurements were obtained: RIGHT ICA:  100 cm/sec CCA:  1031cm/sec SYSTOLIC ICA/CCA RATIO:  1.0 DIASTOLIC ICA/CCA RATIO:  0.7 ECA:  207 cm/sec LEFT ICA:  89 cm/sec CCA:  1594cm/sec SYSTOLIC ICA/CCA RATIO:  0.9 DIASTOLIC ICA/CCA RATIO:  1.9 ECA:  312 cm/sec RIGHT CAROTID ARTERY: There is small amount of  atherosclerotic plaque demonstrated within the right common carotid artery. Mild atherosclerotic plaque within the right carotid bulb as well as the proximal portion of the right internal carotid artery. RIGHT VERTEBRAL ARTERY:  Antegrade flow. LEFT CAROTID ARTERY: There is a small amount of atherosclerotic plaque within the carotid bulb as well as the proximal portion of the internal carotid artery. LEFT VERTEBRAL ARTERY:  Antegrade flow. IMPRESSION: No sonographic evidence to suggest hemodynamically significant stenosis within  the bilateral internal carotid arteries. Electronically Signed   By: Lovey Newcomer M.D.   On: 11/23/2015 16:06   Dg Chest Port 1 View  11/19/2015  CLINICAL DATA:  Shortness of breath, cough EXAM: PORTABLE CHEST 1 VIEW COMPARISON:  07/06/2015 FINDINGS: Prior CABG. Cardiomegaly. Left lower lobe atelectasis or infiltrate with left effusion, similar to prior study. Small right effusion with right base atelectasis. No overt edema/failure. No acute bony abnormality. IMPRESSION: Chronic left pleural effusion with left basilar atelectasis or infiltrate. Small right pleural effusion with right base atelectasis. Electronically Signed   By: Rolm Baptise M.D.   On: 11/19/2015 18:23       ASSESSMENT/PLAN   65 yo white male recent admission to hospital for acute COPD exacerbation with pneumonia with acute diastolic heart failure  1.stop symbicort 2.start Breo for long standing COPD and Incruse for acute bronchospasms 3.check overnight pulse ox-unable to perform 6MWT due to inability to walk 4.PFT's in 1 month with follow up 5.recommmend checking C diff toxin-rehab facility to follow up  6.follow up with cardiology  I have personally obtained a history, examined the patient, evaluated laboratory and independently reviewed imaging results, formulated the assessment and plan and placed orders.  The Patient requires high complexity decision making for assessment and support, frequent  evaluation and titration of therapies, application of advanced monitoring technologies and extensive interpretation of multiple databases.    Patient/Family are satisfied with Plan of action and management. All questions answered  Corrin Parker, M.D.  [facility]  Medical Director [place] Director [facility] Cardio-Pulmonary Department

## 2015-12-31 ENCOUNTER — Encounter: Payer: Self-pay | Admitting: Internal Medicine

## 2016-01-01 ENCOUNTER — Inpatient Hospital Stay
Admission: EM | Admit: 2016-01-01 | Discharge: 2016-01-02 | DRG: 291 | Disposition: A | Discharge status: Skilled Nursing Facility | Payer: Medicare Other | Attending: Internal Medicine | Admitting: Internal Medicine

## 2016-01-01 ENCOUNTER — Encounter: Payer: Self-pay | Admitting: Emergency Medicine

## 2016-01-01 ENCOUNTER — Emergency Department: Payer: Medicare Other

## 2016-01-01 DIAGNOSIS — J189 Pneumonia, unspecified organism: Secondary | ICD-10-CM

## 2016-01-01 DIAGNOSIS — I2510 Atherosclerotic heart disease of native coronary artery without angina pectoris: Secondary | ICD-10-CM | POA: Diagnosis present

## 2016-01-01 DIAGNOSIS — I252 Old myocardial infarction: Secondary | ICD-10-CM | POA: Diagnosis not present

## 2016-01-01 DIAGNOSIS — Z7951 Long term (current) use of inhaled steroids: Secondary | ICD-10-CM

## 2016-01-01 DIAGNOSIS — R0602 Shortness of breath: Secondary | ICD-10-CM | POA: Diagnosis not present

## 2016-01-01 DIAGNOSIS — Z8673 Personal history of transient ischemic attack (TIA), and cerebral infarction without residual deficits: Secondary | ICD-10-CM | POA: Diagnosis not present

## 2016-01-01 DIAGNOSIS — I110 Hypertensive heart disease with heart failure: Principal | ICD-10-CM | POA: Diagnosis present

## 2016-01-01 DIAGNOSIS — Z794 Long term (current) use of insulin: Secondary | ICD-10-CM

## 2016-01-01 DIAGNOSIS — J9601 Acute respiratory failure with hypoxia: Secondary | ICD-10-CM | POA: Diagnosis present

## 2016-01-01 DIAGNOSIS — J449 Chronic obstructive pulmonary disease, unspecified: Secondary | ICD-10-CM | POA: Diagnosis present

## 2016-01-01 DIAGNOSIS — N400 Enlarged prostate without lower urinary tract symptoms: Secondary | ICD-10-CM | POA: Diagnosis present

## 2016-01-01 DIAGNOSIS — I509 Heart failure, unspecified: Secondary | ICD-10-CM

## 2016-01-01 DIAGNOSIS — Z7982 Long term (current) use of aspirin: Secondary | ICD-10-CM | POA: Diagnosis not present

## 2016-01-01 DIAGNOSIS — Z7902 Long term (current) use of antithrombotics/antiplatelets: Secondary | ICD-10-CM

## 2016-01-01 DIAGNOSIS — Z79899 Other long term (current) drug therapy: Secondary | ICD-10-CM | POA: Diagnosis not present

## 2016-01-01 DIAGNOSIS — Z7984 Long term (current) use of oral hypoglycemic drugs: Secondary | ICD-10-CM

## 2016-01-01 DIAGNOSIS — F17210 Nicotine dependence, cigarettes, uncomplicated: Secondary | ICD-10-CM | POA: Diagnosis present

## 2016-01-01 DIAGNOSIS — E119 Type 2 diabetes mellitus without complications: Secondary | ICD-10-CM | POA: Diagnosis present

## 2016-01-01 DIAGNOSIS — I5033 Acute on chronic diastolic (congestive) heart failure: Secondary | ICD-10-CM | POA: Diagnosis present

## 2016-01-01 LAB — CBC
HEMATOCRIT: 34 % — AB (ref 40.0–52.0)
HEMOGLOBIN: 11.2 g/dL — AB (ref 13.0–18.0)
MCH: 25.2 pg — ABNORMAL LOW (ref 26.0–34.0)
MCHC: 33.1 g/dL (ref 32.0–36.0)
MCV: 76.3 fL — AB (ref 80.0–100.0)
Platelets: 193 10*3/uL (ref 150–440)
RBC: 4.46 MIL/uL (ref 4.40–5.90)
RDW: 19.5 % — ABNORMAL HIGH (ref 11.5–14.5)
WBC: 9.1 10*3/uL (ref 3.8–10.6)

## 2016-01-01 LAB — BASIC METABOLIC PANEL
ANION GAP: 4 — AB (ref 5–15)
BUN: 28 mg/dL — ABNORMAL HIGH (ref 6–20)
CHLORIDE: 105 mmol/L (ref 101–111)
CO2: 33 mmol/L — ABNORMAL HIGH (ref 22–32)
Calcium: 8.8 mg/dL — ABNORMAL LOW (ref 8.9–10.3)
Creatinine, Ser: 0.83 mg/dL (ref 0.61–1.24)
GFR calc Af Amer: >60 mL/min (ref >60)
GLUCOSE: 145 mg/dL — AB (ref 65–99)
POTASSIUM: 4.4 mmol/L (ref 3.5–5.1)
Sodium: 142 mmol/L (ref 135–145)

## 2016-01-01 LAB — EXPECTORATED SPUTUM ASSESSMENT W GRAM STAIN, RFLX TO RESP C: Special Requests: NORMAL

## 2016-01-01 LAB — GLUCOSE, CAPILLARY
GLUCOSE-CAPILLARY: 152 mg/dL — AB (ref 65–99)
Glucose-Capillary: 194 mg/dL — ABNORMAL HIGH (ref 65–99)
Glucose-Capillary: 161 mg/dL — ABNORMAL HIGH (ref 65–99)
Glucose-Capillary: 116 mg/dL — ABNORMAL HIGH (ref 65–99)

## 2016-01-01 LAB — MRSA PCR SCREENING: MRSA by PCR: NEGATIVE

## 2016-01-01 LAB — TROPONIN I: Troponin I: 0.03 ng/mL (ref <0.031)

## 2016-01-01 MED ORDER — ONDANSETRON HCL 4 MG PO TABS: 4.0000 mg | ORAL_TABLET | Freq: Four times a day (QID) | ORAL | Status: DC | PRN

## 2016-01-01 MED ORDER — METFORMIN HCL 500 MG PO TABS
500.0000 mg | ORAL_TABLET | Freq: Two times a day (BID) | ORAL | Status: DC
  Administered 2016-01-01 – 2016-01-02 (×3): 500 mg via ORAL
  Filled 2016-01-01 (×3): qty 1

## 2016-01-01 MED ORDER — ACETAMINOPHEN 650 MG RE SUPP: 650.0000 mg | Freq: Four times a day (QID) | RECTAL | Status: DC | PRN

## 2016-01-01 MED ORDER — LISINOPRIL 20 MG PO TABS
20.0000 mg | ORAL_TABLET | Freq: Two times a day (BID) | ORAL | Status: DC
Start: 2016-01-01 — End: 2016-01-02
  Administered 2016-01-01 – 2016-01-02 (×3): 20 mg via ORAL
  Filled 2016-01-01 (×3): qty 1

## 2016-01-01 MED ORDER — LINAGLIPTIN 5 MG PO TABS
5.0000 mg | ORAL_TABLET | Freq: Every day | ORAL | Status: DC
  Administered 2016-01-01 – 2016-01-02 (×2): 5 mg via ORAL
  Filled 2016-01-01 (×2): qty 1

## 2016-01-01 MED ORDER — FUROSEMIDE 10 MG/ML IJ SOLN
40.0000 mg | Freq: Three times a day (TID) | INTRAMUSCULAR | Status: DC
  Administered 2016-01-01 – 2016-01-02 (×4): 40 mg via INTRAVENOUS
  Filled 2016-01-01 (×4): qty 4

## 2016-01-01 MED ORDER — SODIUM CHLORIDE 0.9% FLUSH: 3.0000 mL | Freq: Two times a day (BID) | INTRAVENOUS | Status: DC

## 2016-01-01 MED ORDER — DOCUSATE SODIUM 100 MG PO CAPS
100.0000 mg | ORAL_CAPSULE | Freq: Two times a day (BID) | ORAL | Status: DC
  Administered 2016-01-01 – 2016-01-02 (×3): 100 mg via ORAL
  Filled 2016-01-01 (×3): qty 1

## 2016-01-01 MED ORDER — BISOPROLOL FUMARATE 5 MG PO TABS
5.0000 mg | ORAL_TABLET | Freq: Every day | ORAL | Status: DC
  Administered 2016-01-01 – 2016-01-02 (×2): 5 mg via ORAL
  Filled 2016-01-01 (×2): qty 1

## 2016-01-01 MED ORDER — TAMSULOSIN HCL 0.4 MG PO CAPS
0.4000 mg | ORAL_CAPSULE | Freq: Every day | ORAL | Status: DC
  Administered 2016-01-01: 0.4 mg via ORAL
  Filled 2016-01-01: qty 1

## 2016-01-01 MED ORDER — SODIUM CHLORIDE 0.9% FLUSH
3.0000 mL | Freq: Two times a day (BID) | INTRAVENOUS | Status: DC
  Administered 2016-01-01 (×3): 3 mL via INTRAVENOUS

## 2016-01-01 MED ORDER — ONDANSETRON HCL 4 MG/2ML IJ SOLN: 4.0000 mg | Freq: Four times a day (QID) | INTRAMUSCULAR | Status: DC | PRN

## 2016-01-01 MED ORDER — MIRTAZAPINE 15 MG PO TABS
15.0000 mg | ORAL_TABLET | Freq: Every day | ORAL | Status: DC
  Administered 2016-01-01: 15 mg via ORAL
  Filled 2016-01-01: qty 1

## 2016-01-01 MED ORDER — HYDROCOD POLST-CPM POLST ER 10-8 MG/5ML PO SUER: 5.0000 mL | Freq: Two times a day (BID) | ORAL | Status: DC | PRN

## 2016-01-01 MED ORDER — POLYETHYLENE GLYCOL 3350 17 G PO PACK: 17.0000 g | PACK | Freq: Every day | ORAL | Status: DC | PRN

## 2016-01-01 MED ORDER — IPRATROPIUM-ALBUTEROL 0.5-2.5 (3) MG/3ML IN SOLN: 3.0000 mL | RESPIRATORY_TRACT | Status: DC | PRN

## 2016-01-01 MED ORDER — FLUTICASONE FUROATE-VILANTEROL 100-25 MCG/INH IN AEPB: 1.0000 | INHALATION_SPRAY | Freq: Every day | RESPIRATORY_TRACT | Status: DC

## 2016-01-01 MED ORDER — ATORVASTATIN CALCIUM 20 MG PO TABS
80.0000 mg | ORAL_TABLET | Freq: Every day | ORAL | Status: DC
  Administered 2016-01-01: 80 mg via ORAL
  Filled 2016-01-01 (×2): qty 4

## 2016-01-01 MED ORDER — INSULIN ASPART 100 UNIT/ML ~~LOC~~ SOLN: 0.0000 [IU] | Freq: Every day | SUBCUTANEOUS | Status: DC

## 2016-01-01 MED ORDER — ACETAMINOPHEN 325 MG PO TABS: 650.0000 mg | ORAL_TABLET | Freq: Four times a day (QID) | ORAL | Status: DC | PRN

## 2016-01-01 MED ORDER — ENOXAPARIN SODIUM 40 MG/0.4ML ~~LOC~~ SOLN
40.0000 mg | SUBCUTANEOUS | Status: DC
Start: 2016-01-01 — End: 2016-01-02
  Administered 2016-01-01: 40 mg via SUBCUTANEOUS
  Filled 2016-01-01: qty 0.4

## 2016-01-01 MED ORDER — UMECLIDINIUM BROMIDE 62.5 MCG/INH IN AEPB: 1.0000 | INHALATION_SPRAY | Freq: Every day | RESPIRATORY_TRACT | Status: DC

## 2016-01-01 MED ORDER — VALPROIC ACID 250 MG PO CAPS
250.0000 mg | ORAL_CAPSULE | Freq: Three times a day (TID) | ORAL | Status: DC
Start: 2016-01-01 — End: 2016-01-02
  Administered 2016-01-01 – 2016-01-02 (×4): 250 mg via ORAL
  Filled 2016-01-01 (×7): qty 1

## 2016-01-01 MED ORDER — SODIUM CHLORIDE 0.9 % IV SOLN: 250.0000 mL | INTRAVENOUS | Status: DC | PRN

## 2016-01-01 MED ORDER — CLOPIDOGREL BISULFATE 75 MG PO TABS
75.0000 mg | ORAL_TABLET | Freq: Every day | ORAL | Status: DC
  Administered 2016-01-01 – 2016-01-02 (×2): 75 mg via ORAL
  Filled 2016-01-01 (×2): qty 1

## 2016-01-01 MED ORDER — ASPIRIN EC 81 MG PO TBEC
81.0000 mg | DELAYED_RELEASE_TABLET | Freq: Every day | ORAL | Status: DC
  Administered 2016-01-01 – 2016-01-02 (×2): 81 mg via ORAL
  Filled 2016-01-01 (×2): qty 1

## 2016-01-01 MED ORDER — METOPROLOL TARTRATE 25 MG PO TABS
25.0000 mg | ORAL_TABLET | Freq: Two times a day (BID) | ORAL | Status: DC
  Administered 2016-01-01 – 2016-01-02 (×3): 25 mg via ORAL
  Filled 2016-01-01 (×3): qty 1

## 2016-01-01 MED ORDER — LEVOFLOXACIN IN D5W 750 MG/150ML IV SOLN
750.0000 mg | Freq: Once | INTRAVENOUS | Status: AC
  Administered 2016-01-01: 750 mg via INTRAVENOUS
  Filled 2016-01-01: qty 150

## 2016-01-01 MED ORDER — LEVOFLOXACIN IN D5W 750 MG/150ML IV SOLN
750.0000 mg | INTRAVENOUS | Status: DC
  Administered 2016-01-02: 750 mg via INTRAVENOUS
  Filled 2016-01-01: qty 150

## 2016-01-01 MED ORDER — TIOTROPIUM BROMIDE MONOHYDRATE 18 MCG IN CAPS
18.0000 ug | ORAL_CAPSULE | Freq: Every day | RESPIRATORY_TRACT | Status: DC
Start: 2016-01-01 — End: 2016-01-02
  Administered 2016-01-01: 18 ug via RESPIRATORY_TRACT
  Filled 2016-01-01 (×2): qty 5

## 2016-01-01 MED ORDER — ISOSORBIDE MONONITRATE ER 30 MG PO TB24
30.0000 mg | ORAL_TABLET | Freq: Every day | ORAL | Status: DC
  Administered 2016-01-01 – 2016-01-02 (×2): 30 mg via ORAL
  Filled 2016-01-01 (×2): qty 1

## 2016-01-01 MED ORDER — NITROGLYCERIN 0.4 MG SL SUBL: 0.4000 mg | SUBLINGUAL_TABLET | SUBLINGUAL | Status: DC | PRN

## 2016-01-01 MED ORDER — INSULIN GLARGINE 100 UNIT/ML ~~LOC~~ SOLN
25.0000 [IU] | Freq: Every day | SUBCUTANEOUS | Status: DC
  Administered 2016-01-01: 25 [IU] via SUBCUTANEOUS
  Filled 2016-01-01 (×3): qty 0.25

## 2016-01-01 MED ORDER — PANTOPRAZOLE SODIUM 40 MG PO TBEC
40.0000 mg | DELAYED_RELEASE_TABLET | Freq: Every day | ORAL | Status: DC
  Administered 2016-01-01 – 2016-01-02 (×2): 40 mg via ORAL
  Filled 2016-01-01 (×2): qty 1

## 2016-01-01 MED ORDER — SODIUM CHLORIDE 0.9% FLUSH
3.0000 mL | INTRAVENOUS | Status: DC | PRN
  Administered 2016-01-01: 3 mL via INTRAVENOUS
  Filled 2016-01-01: qty 3

## 2016-01-01 MED ORDER — OLANZAPINE 10 MG PO TABS
10.0000 mg | ORAL_TABLET | Freq: Every day | ORAL | Status: DC
  Administered 2016-01-01: 10 mg via ORAL
  Filled 2016-01-01: qty 1

## 2016-01-01 MED ORDER — BUDESONIDE-FORMOTEROL FUMARATE 160-4.5 MCG/ACT IN AERO
2.0000 | INHALATION_SPRAY | Freq: Two times a day (BID) | RESPIRATORY_TRACT | Status: DC
  Administered 2016-01-01 – 2016-01-02 (×3): 2 via RESPIRATORY_TRACT
  Filled 2016-01-01 (×2): qty 6

## 2016-01-01 MED ORDER — INSULIN ASPART 100 UNIT/ML ~~LOC~~ SOLN
0.0000 [IU] | Freq: Three times a day (TID) | SUBCUTANEOUS | Status: DC
  Administered 2016-01-01 – 2016-01-02 (×3): 3 [IU] via SUBCUTANEOUS
  Filled 2016-01-01 (×3): qty 3

## 2016-01-01 NOTE — Progress Notes (Signed)
[facility] - [place] at [facility]   PATIENT NAME: Mario Peterson    MR#:  [id number]  DATE OF BIRTH:  [birth year]  SUBJECTIVE:   Patient feeling well is willing. Denies short is a rather chest pain  REVIEW OF SYSTEMS:    Review of Systems  Constitutional: Negative for fever, chills and malaise/fatigue.  HENT: Negative for sore throat.   Eyes: Negative for blurred vision.  Respiratory: Negative for cough, hemoptysis, shortness of breath and wheezing.   Cardiovascular: Negative for chest pain, palpitations and leg swelling.  Gastrointestinal: Negative for nausea, vomiting, abdominal pain, diarrhea and blood in stool.  Genitourinary: Negative for dysuria.  Musculoskeletal: Negative for back pain.  Neurological: Negative for dizziness, tremors and headaches.  Endo/Heme/Allergies: Does not bruise/bleed easily.    Tolerating Diet: Yes      DRUG ALLERGIES:  No Known Allergies  VITALS:  Blood pressure 143/55, pulse 62, temperature 97.9 F (36.6 C), temperature source Oral, resp. rate 20, height  (1.727 m), weight 61.372 kg (135 lb 4.8 oz), SpO2 89 %.  PHYSICAL EXAMINATION:   Physical Exam  Constitutional: He is oriented to person, place, and time and well-developed, well-nourished, and in no distress. No distress.  HENT:  Head: Normocephalic.  Eyes: No scleral icterus.  Neck: Normal range of motion. Neck supple. No JVD present. No tracheal deviation present.  Cardiovascular: Normal rate, regular rhythm and normal heart sounds.  Exam reveals no gallop and no friction rub.   No murmur heard. Pulmonary/Chest: Effort normal and breath sounds normal. No respiratory distress. He has no wheezes. He has no rales. He exhibits no tenderness.  Crackles right base.  Abdominal: Soft. Bowel sounds are normal. He exhibits no distension and no mass. There is no tenderness. There is no rebound and no guarding.  Musculoskeletal: Normal range of motion. He  exhibits no edema.  Neurological: He is alert and oriented to person, place, and time.  Skin: Skin is warm. No rash noted. No erythema.  Psychiatric: Affect and judgment normal.      LABORATORY PANEL:   CBC  Recent Labs Lab 01/01/16 0123  WBC 9.1  HGB 11.2*  HCT 34.0*  PLT 193   ------------------------------------------------------------------------------------------------------------------  Chemistries   Recent Labs Lab 01/01/16 0123  NA 142  K 4.4  CL 105  CO2 33*  GLUCOSE 145*  BUN 28*  CREATININE 0.83  CALCIUM 8.8*   ------------------------------------------------------------------------------------------------------------------  Cardiac Enzymes  Recent Labs Lab 01/01/16 0123  TROPONINI 0.03   ------------------------------------------------------------------------------------------------------------------  RADIOLOGY:  Dg Chest Portable 1 View  01/01/2016  CLINICAL DATA:  65 year old male with chest pain EXAM: PORTABLE CHEST 1 VIEW COMPARISON:  Radiograph dated 11/28/2015 FINDINGS: There are bilateral small pleural effusions. The left pleural effusion appears similar to prior study. There has been interval increase the size of the right pleural effusions. There bibasilar airspace densities which may represent atelectatic changes or pneumonia. Stable cardiac silhouette. Median sternotomy wires and CABG vascular clips. No acute osseous pathology. IMPRESSION: Small bilateral pleural effusions with interval increase in the right pleural effusion compared to prior study. Bibasilar atelectatic changes pneumonia. Clinical correlation is recommended. Electronically Signed   By: Elgie Collard M.D.   On: 01/01/2016 01:38     ASSESSMENT AND PLAN:   This is a 65 year old male with a history of diastolic heart failure presented with shortness of breath and hypoxia. He was recently treated for pneumonia however wasn't able to tolerate the full course of treatment due  to  diarrhea.  1. Acute hypoxic respiratory failure: This is due to acute on chronic diastolic heart failure. Continue IV Lasix. Monitor input and output. Repeat BMP for a.m.  2. Recent pneumonia: Patient was on Augmentin which gave him diarrhea. Continue Levaquin.  3. Diabetes: Continue Lantus, TRADJENTA, metformin sliding scale insulin and ADA diet.  4. Essential hypertension: Continue lisinopril, metoprolol and isosorbide.  5. COPD: Patient is not in exacerbation. Continue inhalers.  6. History of CAD/MI: Continue aspirin, atorvastatin, Plavix, metoprolol.  7. BPH: Continue Flomax.    Management plans discussed with the patient and he is in agreement.  CODE STATUS: FULL  TOTAL TIME TAKING CARE OF THIS PATIENT: 30 minutes.     POSSIBLE D/C tomorrow, DEPENDING ON CLINICAL CONDITION.   Denetra Formoso M.D on 01/01/2016 at 10:40 AM  Between 7am to 6pm - Pager - [phone] After 6pm go to [email or web address] - password [email or web address]  [facility]  Office  [phone]  CC: Primary care physician; Pasty Spillers McLean-Scocozza, MD  Note: This dictation was prepared with Dragon dictation along with smaller phrase technology. Any transcriptional errors that result from this process are unintentional.

## 2016-01-01 NOTE — H&P (Signed)
[facility] NAME: Mario Peterson    MR#:  [id number]  DATE OF BIRTH:  19-Mar-1951  DATE OF ADMISSION:  01/01/2016  PRIMARY CARE PHYSICIAN: Nino Glow McLean-Scocozza, MD   REQUESTING/REFERRING PHYSICIAN: Dr. Owens Shark  CHIEF COMPLAINT:   Chief Complaint  Patient presents with  . Chest Pain    HISTORY OF PRESENT ILLNESS:  Mario Peterson  is a 65 y.o. male with a known history of HTN, DM, Diastolic CHF, COPD, CAD s/p CABG, Tobacco abuse here with CP from SNF. Here on CXR he was thought to have PNA/Pl effusions and hospitalist consulted. He has edema, orthopnea. On lasix daily. Continues to smoke Was on abx a few days back that was stopped due to diarrhea.  PAST MEDICAL HISTORY:   Past Medical History  Diagnosis Date  . Diabetes mellitus without complication ([place])   . Hypertension   . COPD (chronic obstructive pulmonary disease) ([place])   . MI (myocardial infarction) ([place])     PAST SURGICAL HISTORY:   Past Surgical History  Procedure Laterality Date  . Cardiac surgery    . Carotid endarterectomy    . Hernia repair      SOCIAL HISTORY:   Social History  Substance Use Topics  . Smoking status: Current Every Day Smoker -- 2.00 packs/day for 60 years    Types: Cigarettes  . Smokeless tobacco: Never Used  . Alcohol Use: No    FAMILY HISTORY:   Family History  Problem Relation Age of Onset  . Diabetes Mother   . Lung cancer Father     DRUG ALLERGIES:  No Known Allergies  REVIEW OF SYSTEMS:   Review of Systems  Constitutional: Positive for malaise/fatigue. Negative for fever, chills and weight loss.  HENT: Negative for hearing loss and nosebleeds.   Eyes: Negative for blurred vision, double vision and pain.  Respiratory: Positive for cough and shortness of breath. Negative for hemoptysis, sputum production and wheezing.   Cardiovascular: Positive for chest pain, orthopnea and leg swelling. Negative for  palpitations.  Gastrointestinal: Negative for nausea, vomiting, abdominal pain, diarrhea and constipation.  Genitourinary: Negative for dysuria and hematuria.  Musculoskeletal: Negative for myalgias, back pain and falls.  Skin: Negative for rash.  Neurological: Positive for weakness. Negative for dizziness, tremors, sensory change, speech change, focal weakness, seizures and headaches.  Endo/Heme/Allergies: Does not bruise/bleed easily.  Psychiatric/Behavioral: Negative for depression and memory loss. The patient is not nervous/anxious.     MEDICATIONS AT HOME:   Prior to Admission medications   Medication Sig Start Date End Date Taking? Authorizing Provider  ACCU-CHEK AVIVA PLUS test strip  11/17/15  Yes Historical Provider, MD  albuterol (PROVENTIL HFA;VENTOLIN HFA) 108 (90 Base) MCG/ACT inhaler Inhale 1-2 puffs into the lungs every 4 (four) hours as needed for wheezing or shortness of breath.    Yes Historical Provider, MD  amoxicillin-clavulanate (AUGMENTIN) 875-125 MG tablet Take 1 tablet by mouth 2 (two) times daily.   Yes Historical Provider, MD  aspirin EC 81 MG tablet Take 1 tablet (81 mg total) by mouth daily. 11/25/15  Yes Gladstone Lighter, MD  atorvastatin (LIPITOR) 80 MG tablet Take 80 mg by mouth at bedtime.   Yes Historical Provider, MD  bisoprolol (ZEBETA) 5 MG tablet Take 5 mg by mouth daily.   Yes Historical Provider, MD  Blood Glucose Monitoring Suppl (ACCU-CHEK AVIVA PLUS) w/Device KIT  11/18/15  Yes Historical Provider, MD  clopidogrel (PLAVIX) 75 MG tablet Take  75 mg by mouth daily.   Yes Historical Provider, MD  esomeprazole (NEXIUM) 40 MG capsule Take 40 mg by mouth daily at 12 noon.   Yes Historical Provider, MD  Fluticasone Furoate-Vilanterol 100-25 MCG/INH AEPB Inhale 1 puff into the lungs daily. 11/28/15  Yes Flora Lipps, MD  furosemide (LASIX) 40 MG tablet Take 40 mg by mouth daily.   Yes Historical Provider, MD  insulin glargine (LANTUS) 100 UNIT/ML injection  Inject 0.25 mLs (25 Units total) into the skin at bedtime. 11/23/15  Yes Theodoro Grist, MD  ipratropium-albuterol (DUONEB) 0.5-2.5 (3) MG/3ML SOLN Take 3 mLs by nebulization every 4 (four) hours as needed. 11/23/15  Yes Theodoro Grist, MD  isosorbide mononitrate (IMDUR) 30 MG 24 hr tablet Take 30 mg by mouth daily.   Yes Historical Provider, MD  lisinopril (PRINIVIL,ZESTRIL) 20 MG tablet Take 1 tablet (20 mg total) by mouth 2 (two) times daily. 11/25/15  Yes Gladstone Lighter, MD  metFORMIN (GLUCOPHAGE) 500 MG tablet Take 500 mg by mouth 2 (two) times daily.   Yes Historical Provider, MD  metoprolol tartrate (LOPRESSOR) 25 MG tablet Take 25 mg by mouth 2 (two) times daily.   Yes Historical Provider, MD  mirtazapine (REMERON) 15 MG tablet Take 15 mg by mouth at bedtime.   Yes Historical Provider, MD  nicotine (NICODERM CQ - DOSED IN MG/24 HOURS) 21 mg/24hr patch Place 1 patch (21 mg total) onto the skin daily. 11/23/15  Yes Theodoro Grist, MD  nitroGLYCERIN (NITROSTAT) 0.4 MG SL tablet Place 0.4 mg under the tongue every 5 (five) minutes as needed for chest pain.   Yes Historical Provider, MD  OLANZapine (ZYPREXA) 10 MG tablet Take 10 mg by mouth at bedtime.   Yes Historical Provider, MD  sitaGLIPtin (JANUVIA) 100 MG tablet Take 100 mg by mouth daily.   Yes Historical Provider, MD  SYMBICORT 160-4.5 MCG/ACT inhaler Inhale 2 puffs into the lungs 2 (two) times daily.    Yes Historical Provider, MD  tamsulosin (FLOMAX) 0.4 MG CAPS capsule Take 0.4 mg by mouth at bedtime.    Yes Historical Provider, MD  Umeclidinium Bromide (INCRUSE ELLIPTA) 62.5 MCG/INH AEPB Inhale 1 puff into the lungs daily. 11/28/15  Yes Flora Lipps, MD  valproic acid (DEPAKENE) 250 MG capsule Take 250 mg by mouth 3 (three) times daily.    Yes Historical Provider, MD  chlorpheniramine-HYDROcodone (TUSSIONEX PENNKINETIC ER) 10-8 MG/5ML SUER Take 5 mLs by mouth every 12 (twelve) hours as needed for cough. Patient not taking: Reported on 11/28/2015  11/23/15   Theodoro Grist, MD  levofloxacin (LEVAQUIN) 750 MG tablet Take 1 tablet (750 mg total) by mouth daily. Patient not taking: Reported on 01/01/2016 11/23/15   Theodoro Grist, MD      VITAL SIGNS:  Blood pressure 148/60, pulse 66, temperature 97.9 F (36.6 C), temperature source Oral, resp. rate 18, height _0  (1.727 m), weight 81.647 kg (180 lb), SpO2 91 %.  PHYSICAL EXAMINATION:  Physical Exam  GENERAL:  65 y.o.-year-old patient lying in the bed with no acute distress.  EYES: Pupils equal, round, reactive to light and accommodation. No scleral icterus. Extraocular muscles intact.  HEENT: Head atraumatic, normocephalic. Oropharynx and nasopharynx clear. No oropharyngeal erythema, moist oral mucosa  NECK:  Supple, no jugular venous distention. No thyroid enlargement, no tenderness.  LUNGS: Bilateral basal crackles CARDIOVASCULAR: S1, S2 normal. No murmurs, rubs, or gallops.  ABDOMEN: Soft, nontender, nondistended. Bowel sounds present. No organomegaly or mass.  EXTREMITIES: No pedal edema, cyanosis, or  clubbing. + 2 pedal & radial pulses b/l.   NEUROLOGIC: Cranial nerves II through XII are intact. No focal Motor or sensory deficits appreciated b/l PSYCHIATRIC: The patient is alert and oriented x 3. Good affect.  SKIN: No obvious rash, lesion, or ulcer.   LABORATORY PANEL:   CBC  Recent Labs Lab 01/01/16 0123  WBC 9.1  HGB 11.2*  HCT 34.0*  PLT 193   ------------------------------------------------------------------------------------------------------------------  Chemistries   Recent Labs Lab 01/01/16 0123  NA 142  K 4.4  CL 105  CO2 33*  GLUCOSE 145*  BUN 28*  CREATININE 0.83  CALCIUM 8.8*   ------------------------------------------------------------------------------------------------------------------  Cardiac Enzymes  Recent Labs Lab 01/01/16 0123  TROPONINI 0.03    ------------------------------------------------------------------------------------------------------------------  RADIOLOGY:  Dg Chest Portable 1 View  01/01/2016  CLINICAL DATA:  65 year old male with chest pain EXAM: PORTABLE CHEST 1 VIEW COMPARISON:  Radiograph dated 11/28/2015 FINDINGS: There are bilateral small pleural effusions. The left pleural effusion appears similar to prior study. There has been interval increase the size of the right pleural effusions. There bibasilar airspace densities which may represent atelectatic changes or pneumonia. Stable cardiac silhouette. Median sternotomy wires and CABG vascular clips. No acute osseous pathology. IMPRESSION: Small bilateral pleural effusions with interval increase in the right pleural effusion compared to prior study. Bibasilar atelectatic changes pneumonia. Clinical correlation is recommended. Electronically Signed   By: Anner Crete M.D.   On: 01/01/2016 01:38     IMPRESSION AND PLAN:   * Acute on chronic diastolic chf with Acute hypoxic resp failure - IV Lasix, Beta blockers - Input and Output - Counseled to limit fluids and Salt - Monitor Bun/Cr and Potassium -Cardiology follow up after discharge  * COPD No wheezing Continue inhalers and Nebs PRN  * Chest pain, Pleuritic Likely from CHF/COPD Repeat troponin  * HTN Continue medications  * IDDM SSI, ADA Lantus  * DVT prophylaxis Lovenox  * Tobacco abuse Counseled to quit smoking   All the records are reviewed and case discussed with ED provider. Management plans discussed with the patient, family and they are in agreement.  CODE STATUS: FULL  TOTAL TIME TAKING CARE OF THIS PATIENT: 45 minutes.    Hillary Bow R M.D on 01/01/2016 at 3:17 AM  Between 7am to 6pm - Pager - [phone]  After 6pm go to [email or web address] - password [email or web address] [facility]  Office  [phone]  CC: Primary care physician; Nino Glow McLean-Scocozza,  MD     Note: This dictation was prepared with Dragon dictation along with smaller phrase technology. Any transcriptional errors that result from this process are unintentional.

## 2016-01-01 NOTE — Clinical Social Work Note (Signed)
Clinical Social Work Assessment  Patient Details  Name: Mario Peterson MRN: [id number] Date of Birth: [birth year]  Date of referral:  01/01/16               Reason for consult:  Facility Placement                Permission sought to share information with:  Family Supports Permission granted to share information::  Yes, Verbal Permission Granted  Name::     Judeth Porch, ex wife   Housing/Transportation Living arrangements for the past 2 months:  [place] of Information:  Patient Patient Interpreter Needed:  None Criminal Activity/Legal Involvement Pertinent to Current Situation/Hospitalization:  No - Comment as needed Significant Relationships:  Adult Children, Other Family Members Lives with:  Facility Resident Do you feel safe going back to the place where you live?  Yes Need for family participation in patient care:  No (Coment)  Care giving concerns:  No care giving concerns identified.    Social Worker assessment / plan:  CSW met with pt to address consult. Pt was admitted from [facility]. Pt is known to CSW from previous admission. CSW explained the process of discharging and returning [facility]. Pt has been at [facility] for over a month, where he started out as STR at [facility] and transitioned to LTC at ALF. Pt shared that he might discharge as early as tomorrow. Facility is able to accept pt at discharge. CSW will continue to follow.   Employment status:  Retired Forensic scientist:  Information systems manager, Medicaid [place] PT Recommendations:  Not assessed at this time [place] / Referral to community resources:  Other (Comment Required) Oceanographer)  Patient/Family's Response to care:  Pt was appreciative of CSW support.   Patient/Family's Understanding of and Emotional Response to Diagnosis, Current Treatment, and Prognosis:  Pt would like to return to facility at discharge.   Emotional Assessment Appearance:  Appears stated  age Attitude/Demeanor/Rapport:  Other (Appropriate) Affect (typically observed):  Accepting, Adaptable, Pleasant Orientation:  Oriented to Self, Oriented to Place, Oriented to  Time, Oriented to Situation Alcohol / Substance use:  Never Used Psych involvement (Current and /or in the community):  No (Comment)  Discharge Needs  Concerns to be addressed:  Adjustment to Illness Readmission within the last 30 days:  No Current discharge risk:  Chronically ill Barriers to Discharge:  Continued Medical Work up   [employer], LCSW 01/01/2016, 2:47 PM

## 2016-01-01 NOTE — ED Notes (Signed)
Pt to room 13 via EMS from [facility] (pt reports resident there due to inability to ambulate r/t neuropathy); st dx with pneumonia week ago, stopped antibiotics few days ago due to subsequent diarrhea which has now resolved; began having mid CP at 8pm (nonradiating with no accomp symptoms) that lasted until arrival of EMS; pt denies any c/o at present; O2 88-91% on ra; O2 applied at 2l/min via [place] to = 92% sat; +smoker (2pk/day); pt placed in gown and on card monitor

## 2016-01-01 NOTE — ED Notes (Signed)
PCXR completed.

## 2016-01-01 NOTE — Progress Notes (Signed)
Pharmacy Antibiotic Note  Mario Peterson is a 65 y.o. male admitted on 01/01/2016 with pneumonia.  Pharmacy has been consulted for levofloxacin dosing.  Plan: Levofloxacin 750 mg IV daily  Height:  (172.7 cm) Weight: 135 lb 4.8 oz (61.372 kg) IBW/kg (Calculated) : 68.4  Temp (24hrs), Avg:97.9 F (36.6 C), Min:97.9 F (36.6 C), Max:97.9 F (36.6 C)   Recent Labs Lab 01/01/16 0123  WBC 9.1  CREATININE 0.83    Estimated Creatinine Clearance: 75 mL/min (by C-G formula based on Cr of 0.83).    No Known Allergies  Antimicrobials this admission: levofloxacin 2/9 >>   Microbiology results: 2/9 BCx: Sent 2/9 MRSA PCR: neg  Thank you for allowing pharmacy to be a part of this patient's care.  Cindi Carbon, PharmD Clinical Pharmacist 01/01/2016 9:55 AM

## 2016-01-01 NOTE — Care Management (Signed)
Patient admitted from [facility].  CSW following. RNCM signing off

## 2016-01-01 NOTE — ED Provider Notes (Signed)
[facility] Emergency Department Provider Note  ____________________________________________  Time seen: 1:15 AM  I have reviewed the triage vital signs and the nursing notes.   HISTORY  Chief Complaint Chest Pain       HPI Mario Peterson is a 65 y.o. male with history of COPD MI hypertension and diabetes presents with difficulty breathing and fever. Of note patient was recently diagnosed with pneumonia week ago however patient states that his antibiotics were discontinued a few days ago secondary to diarrhea. Patient arrived via EMS who states that the patient's oxygen saturation was 85% on their arrival which is consistent with the patient's oxygen saturation on arrival to the emergency department.     Past Medical History  Diagnosis Date  . Diabetes mellitus without complication ([place])   . Hypertension   . COPD (chronic obstructive pulmonary disease) ([place])   . MI (myocardial infarction) [facility])     Patient Active Problem List   Diagnosis Date Noted  . Acute respiratory failure with hypoxia ([place]) 11/23/2015  . Emphysema lung ([place]) 11/23/2015  . CVA (cerebral infarction) 11/23/2015  . Slurring of speech 11/23/2015  . Tobacco abuse 11/23/2015  . Essential hypertension 11/23/2015  . Generalized weakness 11/23/2015  . Pneumonia 11/19/2015  . COPD exacerbation ([place]) 11/19/2015  . Chest pain 07/06/2015    Past Surgical History  Procedure Laterality Date  . Cardiac surgery    . Carotid endarterectomy    . Hernia repair      Current Outpatient Rx  Name  Route  Sig  Dispense  Refill  . ACCU-CHEK AVIVA PLUS test strip                 Dispense as written.   Marland Kitchen albuterol (PROVENTIL HFA;VENTOLIN HFA) 108 (90 Base) MCG/ACT inhaler   Inhalation   Inhale 1-2 puffs into the lungs every 4 (four) hours as needed for wheezing or shortness of breath.          Marland Kitchen amoxicillin-clavulanate (AUGMENTIN) 875-125 MG tablet   Oral   Take 1 tablet by mouth 2  (two) times daily.         Marland Kitchen aspirin EC 81 MG tablet   Oral   Take 1 tablet (81 mg total) by mouth daily.   30 tablet   2   . atorvastatin (LIPITOR) 80 MG tablet   Oral   Take 80 mg by mouth at bedtime.         . bisoprolol (ZEBETA) 5 MG tablet   Oral   Take 5 mg by mouth daily.         . Blood Glucose Monitoring Suppl (ACCU-CHEK AVIVA PLUS) w/Device KIT               . clopidogrel (PLAVIX) 75 MG tablet   Oral   Take 75 mg by mouth daily.         Marland Kitchen esomeprazole (NEXIUM) 40 MG capsule   Oral   Take 40 mg by mouth daily at 12 noon.         . Fluticasone Furoate-Vilanterol 100-25 MCG/INH AEPB   Inhalation   Inhale 1 puff into the lungs daily.   60 each   5   . furosemide (LASIX) 40 MG tablet   Oral   Take 40 mg by mouth daily.         . insulin glargine (LANTUS) 100 UNIT/ML injection   Subcutaneous   Inject 0.25 mLs (25 Units total) into the skin at bedtime.  10 mL   11   . ipratropium-albuterol (DUONEB) 0.5-2.5 (3) MG/3ML SOLN   Nebulization   Take 3 mLs by nebulization every 4 (four) hours as needed.   360 mL   5   . isosorbide mononitrate (IMDUR) 30 MG 24 hr tablet   Oral   Take 30 mg by mouth daily.         Marland Kitchen lisinopril (PRINIVIL,ZESTRIL) 20 MG tablet   Oral   Take 1 tablet (20 mg total) by mouth 2 (two) times daily.   60 tablet   2   . metFORMIN (GLUCOPHAGE) 500 MG tablet   Oral   Take 500 mg by mouth 2 (two) times daily.         . metoprolol tartrate (LOPRESSOR) 25 MG tablet   Oral   Take 25 mg by mouth 2 (two) times daily.         . mirtazapine (REMERON) 15 MG tablet   Oral   Take 15 mg by mouth at bedtime.         . nicotine (NICODERM CQ - DOSED IN MG/24 HOURS) 21 mg/24hr patch   Transdermal   Place 1 patch (21 mg total) onto the skin daily.   28 patch   0   . nitroGLYCERIN (NITROSTAT) 0.4 MG SL tablet   Sublingual   Place 0.4 mg under the tongue every 5 (five) minutes as needed for chest pain.         Marland Kitchen  OLANZapine (ZYPREXA) 10 MG tablet   Oral   Take 10 mg by mouth at bedtime.         . sitaGLIPtin (JANUVIA) 100 MG tablet   Oral   Take 100 mg by mouth daily.         . SYMBICORT 160-4.5 MCG/ACT inhaler   Inhalation   Inhale 2 puffs into the lungs 2 (two) times daily.            Dispense as written.   . tamsulosin (FLOMAX) 0.4 MG CAPS capsule   Oral   Take 0.4 mg by mouth at bedtime.          Marland Kitchen Umeclidinium Bromide (INCRUSE ELLIPTA) 62.5 MCG/INH AEPB   Inhalation   Inhale 1 puff into the lungs daily.   30 each   5   . valproic acid (DEPAKENE) 250 MG capsule   Oral   Take 250 mg by mouth 3 (three) times daily.          . chlorpheniramine-HYDROcodone (TUSSIONEX PENNKINETIC ER) 10-8 MG/5ML SUER   Oral   Take 5 mLs by mouth every 12 (twelve) hours as needed for cough. Patient not taking: Reported on 11/28/2015   140 mL   0   . levofloxacin (LEVAQUIN) 750 MG tablet   Oral   Take 1 tablet (750 mg total) by mouth daily. Patient not taking: Reported on 01/01/2016   5 tablet   0     Allergies No known drug allergies  Family History  Problem Relation Age of Onset  . Diabetes Mother   . Lung cancer Father     Social History Social History  Substance Use Topics  . Smoking status: Current Every Day Smoker -- 2.00 packs/day for 60 years    Types: Cigarettes  . Smokeless tobacco: Never Used  . Alcohol Use: No    Review of Systems  Constitutional: Negative for fever. Eyes: Negative for visual changes. ENT: Negative for sore throat. Cardiovascular: Negative for chest pain. Respiratory: Positive  for cough and dyspnea Gastrointestinal: Negative for abdominal pain, vomiting and diarrhea. Genitourinary: Negative for dysuria. Musculoskeletal: Negative for back pain. Skin: Negative for rash. Neurological: Negative for headaches, focal weakness or numbness.   10-point ROS otherwise negative.  ____________________________________________   PHYSICAL  EXAM:  VITAL SIGNS: ED Triage Vitals  Enc Vitals Group     BP 01/01/16 0108 148/60 mmHg     Pulse Rate 01/01/16 0108 66     Resp 01/01/16 0108 18     Temp 01/01/16 0108 97.9 F (36.6 C)     Temp Source 01/01/16 0108 Oral     SpO2 01/01/16 0108 91 %     Weight 01/01/16 0108 180 lb (81.647 kg)     Height 01/01/16 0108 '5\' 8"'$  (1.727 m)     Head Cir --      Peak Flow --      Pain Score --      Pain Loc --      Pain Edu? --      Excl. in [place]? --      Constitutional: Alert and oriented. Well appearing and in no distress. Eyes: Conjunctivae are normal. PERRL. Normal extraocular movements. ENT   Head: Normocephalic and atraumatic.   Nose: No congestion/rhinnorhea.   Mouth/Throat: Mucous membranes are moist.   Neck: No stridor. Hematological/Lymphatic/Immunilogical: No cervical lymphadenopathy. Cardiovascular: Normal rate, regular rhythm. Normal and symmetric distal pulses are present in all extremities. No murmurs, rubs, or gallops. Respiratory: Positive for bibasilar rhonchi. Breath sounds are clear and equal bilaterally. No wheezes/rales/rhonchi. Gastrointestinal: Soft and nontender. No distention. There is no CVA tenderness. Genitourinary: deferred Musculoskeletal: Nontender with normal range of motion in all extremities. No joint effusions.  No lower extremity tenderness nor edema. Neurologic:  Normal speech and language. No gross focal neurologic deficits are appreciated. Speech is normal.  Skin:  Skin is warm, dry and intact. No rash noted. Psychiatric: Mood and affect are normal. Speech and behavior are normal. Patient exhibits appropriate insight and judgment.  ____________________________________________    LABS (pertinent positives/negatives)  Labs Reviewed  BASIC METABOLIC PANEL - Abnormal; Notable for the following:    CO2 33 (*)    Glucose, Bld 145 (*)    BUN 28 (*)    Calcium 8.8 (*)    Anion gap 4 (*)    All other components within normal limits   CBC - Abnormal; Notable for the following:    Hemoglobin 11.2 (*)    HCT 34.0 (*)    MCV 76.3 (*)    MCH 25.2 (*)    RDW 19.5 (*)    All other components within normal limits  CULTURE, BLOOD (ROUTINE X 2)  CULTURE, BLOOD (ROUTINE X 2)  CULTURE, EXPECTORATED SPUTUM-ASSESSMENT  MRSA PCR SCREENING  TROPONIN I     ____________________________________________   EKG  ED ECG REPORT I, BROWN, [place] N, the attending physician, personally viewed and interpreted this ECG.   Date: 01/01/2016  EKG Time: 1:06  Rate: 69  Rhythm:normal sinus rhythm  Axis: Normal  Intervals:normal  ST&T Change: None   ____________________________________________    RADIOLOGY   DG Chest Portable 1 View (Final result) Result time: 01/01/16 01:38:02   Final result by Rad Results In Interface (01/01/16 01:38:02)   Narrative:   CLINICAL DATA: 65 year old male with chest pain  EXAM: PORTABLE CHEST 1 VIEW  COMPARISON: Radiograph dated 11/28/2015  FINDINGS: There are bilateral small pleural effusions. The left pleural effusion appears similar to prior study. There has been  interval increase the size of the right pleural effusions. There bibasilar airspace densities which may represent atelectatic changes or pneumonia. Stable cardiac silhouette. Median sternotomy wires and CABG vascular clips. No acute osseous pathology.  IMPRESSION: Small bilateral pleural effusions with interval increase in the right pleural effusion compared to prior study.  Bibasilar atelectatic changes pneumonia. Clinical correlation is recommended.   Electronically Signed By: Anner Crete M.D. On: 01/01/2016 01:38             INITIAL IMPRESSION / ASSESSMENT AND PLAN / ED COURSE  Pertinent labs & imaging results that were available during my care of the patient were reviewed by me and considered in my medical decision making (see chart for details).  Concern for hospital associated  pneumonia such patient received IV Levaquin 750 mg in the emergency department with plan to admit the patient to the hospital. Patient discussed with Dr. Marcille Blanco for hospital admission for further evaluation and management.  ____________________________________________   FINAL CLINICAL IMPRESSION(S) / ED DIAGNOSES  Final diagnoses:  Acute on chronic congestive heart failure, unspecified congestive heart failure type ([place])  Healthcare-associated pneumonia      Gregor Hams, MD 01/01/16 [phone]

## 2016-01-01 NOTE — Progress Notes (Signed)
Initial Nutrition Assessment     INTERVENTION:  Meals and snacks: Cater to pt preferences Nutrition diet education:  RD provided "Low Sodium Nutrition Therapy" handout from the Academy of [employer]. Reviewed patient's dietary recall. Provided examples on ways to decrease sodium intake in diet. Discouraged intake of processed foods and use of salt shaker. Encouraged fresh fruits and vegetables as well as whole grain sources of carbohydrates to maximize fiber intake.   RD discussed why it is important for patient to adhere to diet recommendations, and emphasized the role of fluids, foods to avoid, and importance of weighing self daily. Teach back method used.  Expect good compliance. Coordination of care: Will ask nursing for new wt   NUTRITION DIAGNOSIS:   Food and nutrition related knowledge deficit related to chronic illness as evidenced by  (pt with little knowledge regarding low sodium foods).    GOAL:   Patient will meet greater than or equal to 90% of their needs    MONITOR:    (Energy intake)  REASON FOR ASSESSMENT:   Diagnosis    ASSESSMENT:    Pt admitted with CHF, chest pain  Past Medical History  Diagnosis Date  . Diabetes mellitus without complication (HCC)   . Hypertension   . COPD (chronic obstructive pulmonary disease) (HCC)   . MI (myocardial infarction) (HCC)     Current Nutrition: eating well this am per pt  Food/Nutrition-Related History: pt reports normal intake prior to admission   Scheduled Medications:  . aspirin EC  81 mg Oral Daily  . atorvastatin  80 mg Oral QHS  . bisoprolol  5 mg Oral Daily  . budesonide-formoterol  2 puff Inhalation BID  . clopidogrel  75 mg Oral Daily  . docusate sodium  100 mg Oral BID  . enoxaparin (LOVENOX) injection  40 mg Subcutaneous Q24H  . furosemide  40 mg Intravenous 3 times per day  . insulin aspart  0-15 Units Subcutaneous TID WC  . insulin aspart  0-5 Units Subcutaneous QHS  .  insulin glargine  25 Units Subcutaneous QHS  . isosorbide mononitrate  30 mg Oral Daily  . [START ON 01/02/2016] levofloxacin (LEVAQUIN) IV  750 mg Intravenous Q24H  . linagliptin  5 mg Oral Daily  . lisinopril  20 mg Oral BID  . metFORMIN  500 mg Oral BID  . metoprolol tartrate  25 mg Oral BID  . mirtazapine  15 mg Oral QHS  . OLANZapine  10 mg Oral QHS  . pantoprazole  40 mg Oral Daily  . sodium chloride flush  3 mL Intravenous Q12H  . tamsulosin  0.4 mg Oral QHS  . tiotropium  18 mcg Inhalation Daily  . valproic acid  250 mg Oral TID       Electrolyte/Renal Profile and Glucose Profile:   Recent Labs Lab 01/01/16 0123  NA 142  K 4.4  CL 105  CO2 33*  BUN 28*  CREATININE 0.83  CALCIUM 8.8*  GLUCOSE 145*    Gastrointestinal Profile: Last BM:2/8     Weight Change: will request new wt.  Pt reports stable wt      Diet Order:  Diet Carb Modified Fluid consistency:: Thin; Room service appropriate?: Yes  Skin:   reviewed  Height:   Ht Readings from Last 1 Encounters:  01/01/16  (1.727 m)    Weight:   Wt Readings from Last 1 Encounters:  01/01/16 135 lb 4.8 oz (61.372 kg)    Ideal Body Weight:  BMI:  Body mass index is 20.58 kg/(m^2).   EDUCATION NEEDS:   Education needs addressed  LOW Care Level  Katrell Milhorn B. Freida Busman, RD, LDN [phone] (pager) Weekend/On-Call pager [phone])

## 2016-01-01 NOTE — NC FL2 (Signed)
[place] LEVEL OF CARE SCREENING TOOL     IDENTIFICATION  Patient Name: Mario Peterson Birthdate: 08/09/1951 Sex: male Admission Date (Current Location): 01/01/2016  [place] and [place] Number:  Engineering geologist and Address:  [facility], [address] [place], [place] [address]      Provider Number: [id number]  Attending Physician Name and Address:  Bettey Costa, MD  Relative Name and Phone Number:       Current Level of Care: Hospital Recommended Level of Care: [place] Prior Approval Number:    Date Approved/Denied:   PASRR Number:  [id number]   Discharge Plan: Domiciliary (Rest home)    Current Diagnoses: Patient Active Problem List   Diagnosis Date Noted  . CHF (congestive heart failure) ([place]) 01/01/2016  . Acute respiratory failure with hypoxia ([place]) 11/23/2015  . Emphysema lung ([place]) 11/23/2015  . CVA (cerebral infarction) 11/23/2015  . Slurring of speech 11/23/2015  . Tobacco abuse 11/23/2015  . Essential hypertension 11/23/2015  . Generalized weakness 11/23/2015  . Pneumonia 11/19/2015  . COPD exacerbation ([place]) 11/19/2015  . Chest pain 07/06/2015    Orientation RESPIRATION BLADDER Height & Weight     Self, Time, Situation, Place  O2 (2L) Continent Weight: 135 lb 4.8 oz (61.372 kg) Height:  5' 8"  (172.7 cm)  BEHAVIORAL SYMPTOMS/MOOD NEUROLOGICAL BOWEL NUTRITION STATUS      Continent Diet (Carb Modified)  AMBULATORY STATUS COMMUNICATION OF NEEDS Skin   Limited Assist Verbally Normal                       Personal Care Assistance Level of Assistance  Bathing, Feeding, Dressing Bathing Assistance: Limited assistance Feeding assistance: Independent Dressing Assistance: Limited assistance     Functional Limitations Info  Sight, Hearing, Speech Sight Info: Adequate Hearing Info: Adequate Speech Info: Adequate    SPECIAL CARE FACTORS FREQUENCY                        Contractures      Additional Factors Info  Code Status, Allergies, Psychotropic, Insulin Sliding Scale Code Status Info: Full Code Allergies Info: No known allergies Psychotropic Info: Medications Insulin Sliding Scale Info: Ave. 4x/day       DISCHARGE MEDICATIONS:   Current Discharge Medication List    CONTINUE these medications which have CHANGED   Details  levofloxacin (LEVAQUIN) 750 MG tablet Take 1 tablet (750 mg total) by mouth daily. Qty: 5 tablet, Refills: 0      CONTINUE these medications which have NOT CHANGED   Details  ACCU-CHEK AVIVA PLUS test strip     albuterol (PROVENTIL HFA;VENTOLIN HFA) 108 (90 Base) MCG/ACT inhaler Inhale 1-2 puffs into the lungs every 4 (four) hours as needed for wheezing or shortness of breath.     aspirin EC 81 MG tablet Take 1 tablet (81 mg total) by mouth daily. Qty: 30 tablet, Refills: 2    atorvastatin (LIPITOR) 80 MG tablet Take 80 mg by mouth at bedtime.    bisoprolol (ZEBETA) 5 MG tablet Take 5 mg by mouth daily.    Blood Glucose Monitoring Suppl (ACCU-CHEK AVIVA PLUS) w/Device KIT     clopidogrel (PLAVIX) 75 MG tablet Take 75 mg by mouth daily.    esomeprazole (NEXIUM) 40 MG capsule Take 40 mg by mouth daily at 12 noon.    Fluticasone Furoate-Vilanterol 100-25 MCG/INH AEPB Inhale 1 puff into the lungs daily. Qty: 60 each, Refills: 5  furosemide (LASIX) 40 MG tablet Take 40 mg by mouth daily.    insulin glargine (LANTUS) 100 UNIT/ML injection Inject 0.25 mLs (25 Units total) into the skin at bedtime. Qty: 10 mL, Refills: 11    ipratropium-albuterol (DUONEB) 0.5-2.5 (3) MG/3ML SOLN Take 3 mLs by nebulization every 4 (four) hours as needed. Qty: 360 mL, Refills: 5    isosorbide mononitrate (IMDUR) 30 MG 24 hr tablet Take 30 mg by mouth daily.    lisinopril (PRINIVIL,ZESTRIL) 20 MG tablet Take 1 tablet (20 mg total) by mouth 2 (two) times daily. Qty: 60  tablet, Refills: 2    metFORMIN (GLUCOPHAGE) 500 MG tablet Take 500 mg by mouth 2 (two) times daily.    metoprolol tartrate (LOPRESSOR) 25 MG tablet Take 25 mg by mouth 2 (two) times daily.    mirtazapine (REMERON) 15 MG tablet Take 15 mg by mouth at bedtime.    nicotine (NICODERM CQ - DOSED IN MG/24 HOURS) 21 mg/24hr patch Place 1 patch (21 mg total) onto the skin daily. Qty: 28 patch, Refills: 0    nitroGLYCERIN (NITROSTAT) 0.4 MG SL tablet Place 0.4 mg under the tongue every 5 (five) minutes as needed for chest pain.    OLANZapine (ZYPREXA) 10 MG tablet Take 10 mg by mouth at bedtime.    sitaGLIPtin (JANUVIA) 100 MG tablet Take 100 mg by mouth daily.    SYMBICORT 160-4.5 MCG/ACT inhaler Inhale 2 puffs into the lungs 2 (two) times daily.     tamsulosin (FLOMAX) 0.4 MG CAPS capsule Take 0.4 mg by mouth at bedtime.     Umeclidinium Bromide (INCRUSE ELLIPTA) 62.5 MCG/INH AEPB Inhale 1 puff into the lungs daily. Qty: 30 each, Refills: 5    valproic acid (DEPAKENE) 250 MG capsule Take 250 mg by mouth 3 (three) times daily.     chlorpheniramine-HYDROcodone (TUSSIONEX PENNKINETIC ER) 10-8 MG/5ML SUER Take 5 mLs by mouth every 12 (twelve) hours as needed for cough. Qty: 140 mL, Refills: 0      STOP taking these medications     amoxicillin-clavulanate (AUGMENTIN) 875-125 MG tablet            Discharge Medications: Please see discharge summary for a list of discharge medications.  Relevant Imaging Results:  Relevant Lab Results:   Additional Information SSN:  [id number]  Darden Dates, LCSW

## 2016-01-02 ENCOUNTER — Inpatient Hospital Stay: Payer: Medicare Other

## 2016-01-02 LAB — BASIC METABOLIC PANEL
ANION GAP: 4 — AB (ref 5–15)
BUN: 33 mg/dL — ABNORMAL HIGH (ref 6–20)
CHLORIDE: 105 mmol/L (ref 101–111)
CO2: 30 mmol/L (ref 22–32)
Calcium: 8.6 mg/dL — ABNORMAL LOW (ref 8.9–10.3)
Creatinine, Ser: 1.02 mg/dL (ref 0.61–1.24)
GFR calc non Af Amer: >60 mL/min (ref >60)
Glucose, Bld: 119 mg/dL — ABNORMAL HIGH (ref 65–99)
POTASSIUM: 4.3 mmol/L (ref 3.5–5.1)
Sodium: 139 mmol/L (ref 135–145)

## 2016-01-02 LAB — GLUCOSE, CAPILLARY
GLUCOSE-CAPILLARY: 158 mg/dL — AB (ref 65–99)
Glucose-Capillary: 88 mg/dL (ref 65–99)

## 2016-01-02 MED ORDER — LEVOFLOXACIN 750 MG PO TABS: 750.0000 mg | ORAL_TABLET | Freq: Every day | ORAL | Status: DC

## 2016-01-02 NOTE — Discharge Summary (Signed)
[place] at [place] NAME: Mario Peterson    MR#:  [id number]  DATE OF BIRTH:  1951-09-13  DATE OF ADMISSION:  01/01/2016 ADMITTING PHYSICIAN: Hillary Bow, MD  DATE OF DISCHARGE: 01/02/2016 PRIMARY CARE PHYSICIAN: Nino Glow McLean-Scocozza, MD    ADMISSION DIAGNOSIS:  sob  DISCHARGE DIAGNOSIS:  Active Problems:   CHF (congestive heart failure) ([place])   SECONDARY DIAGNOSIS:   Past Medical History  Diagnosis Date  . Diabetes mellitus without complication ([place])   . Hypertension   . COPD (chronic obstructive pulmonary disease) ([place])   . MI (myocardial infarction) [facility])     HOSPITAL COURSE:   This is a 65 year old male with a history of diastolic heart failure presented with shortness of breath and hypoxia. He was recently treated for pneumonia however wasn't able to tolerate the full course of treatment due to diarrhea.  1. Acute hypoxic respiratory failure: This is due to acute on chronic diastolic heart failure. He was on IV Lasix and diuresed well. He will go back with his regular PO LASIX dose Creatinine is stable.   2. Recent pneumonia: Patient was on Augmentin which gave him diarrhea prior to admission. He will be discharged on Levaquin which he tolerated well here.  3. Diabetes: Continue Lantus, TRADJENTA, metformin and ADA diet at discharge.  4. Essential hypertension: Continue lisinopril, metoprolol and isosorbide.  5. COPD: Patient is not in exacerbation. Continue inhalers.  6. History of CAD/MI: Continue aspirin, atorvastatin, Plavix, metoprolol.  7. BPH: Continue Flomax.     DISCHARGE CONDITIONS AND DIET:   Stable  Diabetic diet  CONSULTS OBTAINED:     DRUG ALLERGIES:  No Known Allergies  DISCHARGE MEDICATIONS:   Current Discharge Medication List    CONTINUE these medications which have CHANGED   Details  levofloxacin (LEVAQUIN) 750 MG tablet Take 1 tablet (750 mg total) by mouth  daily. Qty: 5 tablet, Refills: 0      CONTINUE these medications which have NOT CHANGED   Details  ACCU-CHEK AVIVA PLUS test strip     albuterol (PROVENTIL HFA;VENTOLIN HFA) 108 (90 Base) MCG/ACT inhaler Inhale 1-2 puffs into the lungs every 4 (four) hours as needed for wheezing or shortness of breath.     aspirin EC 81 MG tablet Take 1 tablet (81 mg total) by mouth daily. Qty: 30 tablet, Refills: 2    atorvastatin (LIPITOR) 80 MG tablet Take 80 mg by mouth at bedtime.    bisoprolol (ZEBETA) 5 MG tablet Take 5 mg by mouth daily.    Blood Glucose Monitoring Suppl (ACCU-CHEK AVIVA PLUS) w/Device KIT     clopidogrel (PLAVIX) 75 MG tablet Take 75 mg by mouth daily.    esomeprazole (NEXIUM) 40 MG capsule Take 40 mg by mouth daily at 12 noon.    Fluticasone Furoate-Vilanterol 100-25 MCG/INH AEPB Inhale 1 puff into the lungs daily. Qty: 60 each, Refills: 5    furosemide (LASIX) 40 MG tablet Take 40 mg by mouth daily.    insulin glargine (LANTUS) 100 UNIT/ML injection Inject 0.25 mLs (25 Units total) into the skin at bedtime. Qty: 10 mL, Refills: 11    ipratropium-albuterol (DUONEB) 0.5-2.5 (3) MG/3ML SOLN Take 3 mLs by nebulization every 4 (four) hours as needed. Qty: 360 mL, Refills: 5    isosorbide mononitrate (IMDUR) 30 MG 24 hr tablet Take 30 mg by mouth daily.    lisinopril (PRINIVIL,ZESTRIL) 20 MG tablet Take 1 tablet (20 mg total) by mouth 2 (  two) times daily. Qty: 60 tablet, Refills: 2    metFORMIN (GLUCOPHAGE) 500 MG tablet Take 500 mg by mouth 2 (two) times daily.    metoprolol tartrate (LOPRESSOR) 25 MG tablet Take 25 mg by mouth 2 (two) times daily.    mirtazapine (REMERON) 15 MG tablet Take 15 mg by mouth at bedtime.    nicotine (NICODERM CQ - DOSED IN MG/24 HOURS) 21 mg/24hr patch Place 1 patch (21 mg total) onto the skin daily. Qty: 28 patch, Refills: 0    nitroGLYCERIN (NITROSTAT) 0.4 MG SL tablet Place 0.4 mg under the tongue every 5 (five) minutes as needed  for chest pain.    OLANZapine (ZYPREXA) 10 MG tablet Take 10 mg by mouth at bedtime.    sitaGLIPtin (JANUVIA) 100 MG tablet Take 100 mg by mouth daily.    SYMBICORT 160-4.5 MCG/ACT inhaler Inhale 2 puffs into the lungs 2 (two) times daily.     tamsulosin (FLOMAX) 0.4 MG CAPS capsule Take 0.4 mg by mouth at bedtime.     Umeclidinium Bromide (INCRUSE ELLIPTA) 62.5 MCG/INH AEPB Inhale 1 puff into the lungs daily. Qty: 30 each, Refills: 5    valproic acid (DEPAKENE) 250 MG capsule Take 250 mg by mouth 3 (three) times daily.     chlorpheniramine-HYDROcodone (TUSSIONEX PENNKINETIC ER) 10-8 MG/5ML SUER Take 5 mLs by mouth every 12 (twelve) hours as needed for cough. Qty: 140 mL, Refills: 0      STOP taking these medications     amoxicillin-clavulanate (AUGMENTIN) 875-125 MG tablet               Today   CHIEF COMPLAINT:  Ready for discharge today   VITAL SIGNS:  Blood pressure 133/51, pulse 60, temperature 98.4 F (36.9 C), temperature source Oral, resp. rate 20, height 5' 8"  (1.727 m), weight 84.006 kg (185 lb 3.2 oz), SpO2 91 %.   REVIEW OF SYSTEMS:  Review of Systems  Constitutional: Negative for fever, chills and malaise/fatigue.  HENT: Negative for sore throat.   Eyes: Negative for blurred vision.  Respiratory: Negative for cough, hemoptysis, shortness of breath and wheezing.   Cardiovascular: Negative for chest pain, palpitations and leg swelling.  Gastrointestinal: Negative for nausea, vomiting, abdominal pain, diarrhea and blood in stool.  Genitourinary: Negative for dysuria.  Musculoskeletal: Negative for back pain.  Neurological: Negative for dizziness, tremors and headaches.  Endo/Heme/Allergies: Does not bruise/bleed easily.     PHYSICAL EXAMINATION:  GENERAL:  65 y.o.-year-old patient lying in the bed with no acute distress.  NECK:  Supple, no jugular venous distention. No thyroid enlargement, no tenderness.  LUNGS: Normal breath sounds bilaterally,  no wheezing, rales,rhonchi  No use of accessory muscles of respiration.  CARDIOVASCULAR: S1, S2 normal. No murmurs, rubs, or gallops.  ABDOMEN: Soft, non-tender, non-distended. Bowel sounds present. No organomegaly or mass.  EXTREMITIES: No pedal edema, cyanosis, or clubbing.  PSYCHIATRIC: The patient is alert and oriented x 3.  SKIN: No obvious rash, lesion, or ulcer.   DATA REVIEW:   CBC  Recent Labs Lab 01/01/16 0123  WBC 9.1  HGB 11.2*  HCT 34.0*  PLT 193    Chemistries   Recent Labs Lab 01/02/16 0416  NA 139  K 4.3  CL 105  CO2 30  GLUCOSE 119*  BUN 33*  CREATININE 1.02  CALCIUM 8.6*    Cardiac Enzymes  Recent Labs Lab 01/01/16 0123  TROPONINI 0.03    Microbiology Results  @[email or web address] @  RADIOLOGY:  Dg Chest 1 View  01/02/2016  CLINICAL DATA:  Pneumonia. EXAM: CHEST 1 VIEW COMPARISON:  01/01/2016. FINDINGS: Prior CABG. Cardiomegaly. Low lung volumes with bibasilar atelectasis and/or infiltrates. Bilateral pleural effusions. Slight worsening on today's exam. No pneumothorax. IMPRESSION: 1. Persistent bibasilar atelectasis and/or infiltrates and bilateral pleural effusions. Slight worsening on today's exam . 2. Prior CABG. Cardiomegaly. Mild component congestive heart failure cannot be excluded. Electronically Signed   By: Marcello Moores  Register   On: 01/02/2016 07:14   Dg Chest Portable 1 View  01/01/2016  CLINICAL DATA:  65 year old male with chest pain EXAM: PORTABLE CHEST 1 VIEW COMPARISON:  Radiograph dated 11/28/2015 FINDINGS: There are bilateral small pleural effusions. The left pleural effusion appears similar to prior study. There has been interval increase the size of the right pleural effusions. There bibasilar airspace densities which may represent atelectatic changes or pneumonia. Stable cardiac silhouette. Median sternotomy wires and CABG vascular clips. No acute osseous pathology. IMPRESSION: Small bilateral pleural effusions with interval increase in the  right pleural effusion compared to prior study. Bibasilar atelectatic changes pneumonia. Clinical correlation is recommended. Electronically Signed   By: Anner Crete M.D.   On: 01/01/2016 01:38      Management plans discussed with the patient and he is in agreement. Stable for discharge SNF  Patient should follow up with PCP in 1 week  CODE STATUS:     Code Status Orders        Start     Ordered   01/01/16 0315  Full code   Continuous     01/01/16 0315    Code Status History    Date Active Date Inactive Code Status Order ID Comments User Context   11/19/2015  8:29 PM 11/25/2015  6:29 PM DNR [id number]  Demetrios Loll, MD ED   07/06/2015  4:59 PM 07/08/2015  6:43 PM Full Code [id number]  Baxter Hire, MD ED      TOTAL TIME TAKING CARE OF THIS PATIENT: 35 minutes.    Note: This dictation was prepared with Dragon dictation along with smaller phrase technology. Any transcriptional errors that result from this process are unintentional.  Deldrick Linch M.D on 01/02/2016 at 9:06 AM  Between 7am to 6pm - Pager - [phone] After 6pm go to [email or web address] - password [email or web address] [facility]  Office  [phone]  CC: Primary care physician; Lyda Perone, MD

## 2016-01-02 NOTE — Clinical Social Work Note (Signed)
Patient to discharge today to return to [facility]. CSW spoke with Arline Asp at [facility] to confirm he could return today. CSW spoke with patient and he stated he could not reach his ex wife and could not reach his daughter for transport and wishes to go by EMS. CSW informed patient he would incur a bill for the EMS transport as it is not medically necessary. Patient became angry and stated that his insurance should pay for it. CSW informed patient that since he is able to ambulate small steps, that medicare will more than likely not cover the transport. Patient remained angry and stated he wanted to leave as soon as he could and he would pay the EMS bill. York Spaniel MSW,LCSW [phone]

## 2016-01-02 NOTE — Progress Notes (Signed)
Patient discharged to [facility], report called to [facility] spoke to [facility]. Patient alert and oriented, ambulates without assistance. Denies pain at this time. No acute distress noted. Patient states that he does not have anyone to take him to [facility]. Case manager aware and spoke to patient about trying to get family members to take him to [facility].

## 2016-01-02 NOTE — Discharge Instructions (Signed)
[facility] Failure Clinic appointment on January 15, 2016 at 9:00am with Hanalei Glace, FNP. Please call [phone] to reschedule.  °

## 2016-01-02 NOTE — Progress Notes (Signed)
An initial appointment was scheduled at the [facility] Failure Clinic on January 15, 2016 at 9:00am. Thank you.

## 2016-01-04 LAB — CULTURE, RESPIRATORY W GRAM STAIN
Culture: NORMAL
Special Requests: NORMAL

## 2016-01-05 ENCOUNTER — Ambulatory Visit (INDEPENDENT_AMBULATORY_CARE_PROVIDER_SITE_OTHER): Payer: Medicare Other | Admitting: *Deleted

## 2016-01-05 DIAGNOSIS — J449 Chronic obstructive pulmonary disease, unspecified: Secondary | ICD-10-CM

## 2016-01-05 LAB — PULMONARY FUNCTION TEST
DL/VA % PRED: 58 %
DL/VA: 2.57 ml/min/mmHg/L
DLCO UNC % PRED: 109 %
DLCO UNC: 30.95 ml/min/mmHg
FEF 25-75 PRE: 0.91 L/s
FEF 25-75 Post: 1.41 L/sec
FEF2575-%Change-Post: 53 %
FEF2575-%PRED-POST: 59 %
FEF2575-%PRED-PRE: 38 %
FEV1-%Change-Post: 11 %
FEV1-%PRED-PRE: 48 %
FEV1-%Pred-Post: 54 %
FEV1-POST: 1.63 L
FEV1-Pre: 1.46 L
FEV1FVC-%CHANGE-POST: 3 %
FEV1FVC-%PRED-PRE: 93 %
FEV6-%CHANGE-POST: 7 %
FEV6-%Pred-Post: 59 %
FEV6-%Pred-Pre: 55 %
FEV6-Post: 2.28 L
FEV6-Pre: 2.11 L
FEV6FVC-%Change-Post: 0 %
FEV6FVC-%Pred-Post: 106 %
FEV6FVC-%Pred-Pre: 106 %
FVC-%Change-Post: 7 %
FVC-%PRED-POST: 56 %
FVC-%Pred-Pre: 52 %
FVC-POST: 2.28 L
FVC-Pre: 2.11 L
POST FEV1/FVC RATIO: 72 %
PRE FEV1/FVC RATIO: 69 %
Post FEV6/FVC ratio: 100 %
Pre FEV6/FVC Ratio: 100 %

## 2016-01-05 NOTE — Progress Notes (Signed)
PFT performed today with nitrogen washout. 

## 2016-01-06 LAB — CULTURE, BLOOD (ROUTINE X 2)
Culture: NO GROWTH
Culture: NO GROWTH

## 2016-01-08 ENCOUNTER — Encounter: Payer: Self-pay | Admitting: Internal Medicine

## 2016-01-08 ENCOUNTER — Ambulatory Visit (INDEPENDENT_AMBULATORY_CARE_PROVIDER_SITE_OTHER): Payer: Medicare Other | Admitting: Internal Medicine

## 2016-01-08 VITALS — BP 124/70 | HR 71 | Ht 67.0 in | Wt 188.8 lb

## 2016-01-08 DIAGNOSIS — J449 Chronic obstructive pulmonary disease, unspecified: Secondary | ICD-10-CM | POA: Diagnosis not present

## 2016-01-08 NOTE — Progress Notes (Signed)
[facility] Consultation      Date: 01/08/2016,   MRN# [id number] Mario Peterson [birth year] Code Status:  Hosp day:@LENGTHOFSTAYDAYS @ Referring MD: @ATDPROV @     PCP:      AdmissionWeight: 188 lb 12.8 oz (85.639 kg)                 CurrentWeight: 188 lb 12.8 oz (85.639 kg) Mario Peterson is a 65 y.o. old male seen in consultation for COPD and pneumonia.  Referring Physician:Dr Bridgett Larsson C CT CHIEF COMPLAINT:  Follow up for COPD, recent admission for [place]  Patient still smokes, taking both BREO and SYmbicort, using albuterol as needed States that he feels well today, wheezing andn cough have improved No signs of infection at this time  ECHO 11/22/15  showed grade 1 diastolic dysfunction CT chest 11/22/15: emphysema changes, atelectasis/effusion left lower lobe PFT 12/2015 Ratio 69% FEV1 48% DLCO 109% EPFTC PFTHO    Current Medication:  Current outpatient prescriptions:  .  ACCU-CHEK AVIVA PLUS test strip, , Disp: , Rfl:  .  albuterol (PROVENTIL HFA;VENTOLIN HFA) 108 (90 Base) MCG/ACT inhaler, Inhale 1-2 puffs into the lungs every 4 (four) hours as needed for wheezing or shortness of breath. , Disp: , Rfl:  .  aspirin EC 81 MG tablet, Take 1 tablet (81 mg total) by mouth daily., Disp: 30 tablet, Rfl: 2 .  atorvastatin (LIPITOR) 80 MG tablet, Take 80 mg by mouth at bedtime., Disp: , Rfl:  .  bisoprolol (ZEBETA) 5 MG tablet, Take 5 mg by mouth daily., Disp: , Rfl:  .  Blood Glucose Monitoring Suppl (ACCU-CHEK AVIVA PLUS) w/Device KIT, , Disp: , Rfl:  .  chlorpheniramine-HYDROcodone (TUSSIONEX PENNKINETIC ER) 10-8 MG/5ML SUER, Take 5 mLs by mouth every 12 (twelve) hours as needed for cough., Disp: 140 mL, Rfl: 0 .  clopidogrel (PLAVIX) 75 MG tablet, Take 75 mg by mouth daily., Disp: , Rfl:  .  esomeprazole (NEXIUM) 40 MG capsule, Take 40 mg by mouth daily at 12 noon., Disp: , Rfl:  .  Fluticasone Furoate-Vilanterol 100-25  MCG/INH AEPB, Inhale 1 puff into the lungs daily., Disp: 60 each, Rfl: 5 .  furosemide (LASIX) 40 MG tablet, Take 40 mg by mouth daily., Disp: , Rfl:  .  insulin glargine (LANTUS) 100 UNIT/ML injection, Inject 0.25 mLs (25 Units total) into the skin at bedtime., Disp: 10 mL, Rfl: 11 .  ipratropium-albuterol (DUONEB) 0.5-2.5 (3) MG/3ML SOLN, Take 3 mLs by nebulization every 4 (four) hours as needed., Disp: 360 mL, Rfl: 5 .  isosorbide mononitrate (IMDUR) 30 MG 24 hr tablet, Take 30 mg by mouth daily., Disp: , Rfl:  .  lisinopril (PRINIVIL,ZESTRIL) 20 MG tablet, Take 1 tablet (20 mg total) by mouth 2 (two) times daily., Disp: 60 tablet, Rfl: 2 .  metFORMIN (GLUCOPHAGE) 500 MG tablet, Take 500 mg by mouth 2 (two) times daily., Disp: , Rfl:  .  metoprolol tartrate (LOPRESSOR) 25 MG tablet, Take 25 mg by mouth 2 (two) times daily., Disp: , Rfl:  .  mirtazapine (REMERON) 15 MG tablet, Take 15 mg by mouth at bedtime., Disp: , Rfl:  .  nicotine (NICODERM CQ - DOSED IN MG/24 HOURS) 21 mg/24hr patch, Place 1 patch (21 mg total) onto the skin daily., Disp: 28 patch, Rfl: 0 .  nitroGLYCERIN (NITROSTAT) 0.4 MG SL tablet, Place 0.4 mg under the tongue every 5 (five) minutes as needed for chest pain., Disp: , Rfl:  .  OLANZapine (ZYPREXA) 10 MG tablet, Take 10 mg by mouth at bedtime., Disp: , Rfl:  .  sitaGLIPtin (JANUVIA) 100 MG tablet, Take 100 mg by mouth daily., Disp: , Rfl:  .  SYMBICORT 160-4.5 MCG/ACT inhaler, Inhale 2 puffs into the lungs 2 (two) times daily. , Disp: , Rfl:  .  tamsulosin (FLOMAX) 0.4 MG CAPS capsule, Take 0.4 mg by mouth at bedtime. , Disp: , Rfl:  .  Umeclidinium Bromide (INCRUSE ELLIPTA) 62.5 MCG/INH AEPB, Inhale 1 puff into the lungs daily., Disp: 30 each, Rfl: 5 .  valproic acid (DEPAKENE) 250 MG capsule, Take 250 mg by mouth 3 (three) times daily. , Disp: , Rfl:     ALLERGIES   Review of patient's allergies indicates no known allergies.     REVIEW OF SYSTEMS   Review of  Systems  Constitutional: Negative for fever, chills, weight loss and malaise/fatigue.  HENT: Negative for congestion.   Respiratory: Negative for cough, hemoptysis, sputum production, shortness of breath and wheezing.   Cardiovascular: Negative for chest pain, palpitations, orthopnea and leg swelling.  Gastrointestinal: Negative for nausea, vomiting and diarrhea.  Musculoskeletal: Positive for joint pain.  Psychiatric/Behavioral: The patient is not nervous/anxious.   All other systems reviewed and are negative.    VS: BP 124/70 mmHg  Pulse 71  Ht 5' 7"  (1.702 m)  Wt 188 lb 12.8 oz (85.639 kg)  BMI 29.56 kg/m2  SpO2 92%     PHYSICAL EXAM  Physical Exam  Constitutional: He is oriented to person, place, and time. He appears well-developed and well-nourished. No distress.  Neck: Normal range of motion.  Cardiovascular: Normal rate, regular rhythm and normal heart sounds.   No murmur heard. Pulmonary/Chest: No stridor. No respiratory distress. He has no wheezes.  Musculoskeletal: Normal range of motion. He exhibits no edema.  Neurological: He is alert and oriented to person, place, and time.  Skin: Skin is warm. He is not diaphoretic.  Psychiatric: He has a normal mood and affect.          IMAGING    Dg Chest 1 View  01/02/2016  CLINICAL DATA:  Pneumonia. EXAM: CHEST 1 VIEW COMPARISON:  01/01/2016. FINDINGS: Prior CABG. Cardiomegaly. Low lung volumes with bibasilar atelectasis and/or infiltrates. Bilateral pleural effusions. Slight worsening on today's exam. No pneumothorax. IMPRESSION: 1. Persistent bibasilar atelectasis and/or infiltrates and bilateral pleural effusions. Slight worsening on today's exam . 2. Prior CABG. Cardiomegaly. Mild component congestive heart failure cannot be excluded. Electronically Signed   By: Marcello Moores  Register   On: 01/02/2016 07:14   Dg Chest Portable 1 View  01/01/2016  CLINICAL DATA:  65 year old male with chest pain EXAM: PORTABLE CHEST 1 VIEW  COMPARISON:  Radiograph dated 11/28/2015 FINDINGS: There are bilateral small pleural effusions. The left pleural effusion appears similar to prior study. There has been interval increase the size of the right pleural effusions. There bibasilar airspace densities which may represent atelectatic changes or pneumonia. Stable cardiac silhouette. Median sternotomy wires and CABG vascular clips. No acute osseous pathology. IMPRESSION: Small bilateral pleural effusions with interval increase in the right pleural effusion compared to prior study. Bibasilar atelectatic changes pneumonia. Clinical correlation is recommended. Electronically Signed   By: Anner Crete M.D.   On: 01/01/2016 01:38       ASSESSMENT/PLAN   65 yo white male recent admission to hospital for acute COPD exacerbation with pneumonia with acute diastolic heart failure, with Moderate COPD based on Puomonary Function testing with Gold STAGE B PFT's  show ratio 69%, FEV1 48%  1.stop symbicort 2.continue Breo for long standing COPD and Incruse for acute bronchospasms 3.will follow up results on ONO-will assess oxygen needs at nighht 4.follow up cardiology as needed  Follow up 3 months  The Patient requires high complexity decision making for assessment and support, frequent evaluation and titration of therapies, application of advanced monitoring technologies and extensive interpretation of multiple databases.  Patient satisfied with Plan of action and management. All questions answered  Corrin Parker, M.D.  [facility]  Medical Director [facility] Cardio-Pulmonary Department

## 2016-01-08 NOTE — Patient Instructions (Signed)
Chronic Obstructive Pulmonary Disease Chronic obstructive pulmonary disease (COPD) is a common lung condition in which airflow from the lungs is limited. COPD is a general term that can be used to describe many different lung problems that limit airflow, including both chronic bronchitis and emphysema. If you have COPD, your lung function will probably never return to normal, but there are measures you can take to improve lung function and make yourself feel better. CAUSES   Smoking (common).  Exposure to secondhand smoke.  Genetic problems.  Chronic inflammatory lung diseases or recurrent infections. SYMPTOMS  Shortness of breath, especially with physical activity.  Deep, persistent (chronic) cough with a large amount of thick mucus.  Wheezing.  Rapid breaths (tachypnea).  Gray or bluish discoloration (cyanosis) of the skin, especially in your fingers, toes, or lips.  Fatigue.  Weight loss.  Frequent infections or episodes when breathing symptoms become much worse (exacerbations).  Chest tightness. DIAGNOSIS Your health care provider will take a medical history and perform a physical examination to diagnose COPD. Additional tests for COPD may include:  Lung (pulmonary) function tests.  Chest X-ray.  CT scan.  Blood tests. TREATMENT  Treatment for COPD may include:  Inhaler and nebulizer medicines. These help manage the symptoms of COPD and make your breathing more comfortable.  Supplemental oxygen. Supplemental oxygen is only helpful if you have a low oxygen level in your blood.  Exercise and physical activity. These are beneficial for nearly all people with COPD.  Lung surgery or transplant.  Nutrition therapy to gain weight, if you are underweight.  Pulmonary rehabilitation. This may involve working with a team of health care providers and specialists, such as respiratory, occupational, and physical therapists. HOME CARE INSTRUCTIONS  Take all medicines  (inhaled or pills) as directed by your health care provider.  Avoid over-the-counter medicines or cough syrups that dry up your airway (such as antihistamines) and slow down the elimination of secretions unless instructed otherwise by your health care provider.  If you are a smoker, the most important thing that you can do is stop smoking. Continuing to smoke will cause further lung damage and breathing trouble. Ask your health care provider for help with quitting smoking. He or she can direct you to community resources or hospitals that provide support.  Avoid exposure to irritants such as smoke, chemicals, and fumes that aggravate your breathing.  Use oxygen therapy and pulmonary rehabilitation if directed by your health care provider. If you require home oxygen therapy, ask your health care provider whether you should purchase a pulse oximeter to measure your oxygen level at home.  Avoid contact with individuals who have a contagious illness.  Avoid extreme temperature and humidity changes.  Eat healthy foods. Eating smaller, more frequent meals and resting before meals may help you maintain your strength.  Stay active, but balance activity with periods of rest. Exercise and physical activity will help you maintain your ability to do things you want to do.  Preventing infection and hospitalization is very important when you have COPD. Make sure to receive all the vaccines your health care provider recommends, especially the pneumococcal and influenza vaccines. Ask your health care provider whether you need a pneumonia vaccine.  Learn and use relaxation techniques to manage stress.  Learn and use controlled breathing techniques as directed by your health care provider. Controlled breathing techniques include:  Pursed lip breathing. Start by breathing in (inhaling) through your nose for 1 second. Then, purse your lips as if you were   going to whistle and breathe out (exhale) through the  pursed lips for 2 seconds.  Diaphragmatic breathing. Start by putting one hand on your abdomen just above your waist. Inhale slowly through your nose. The hand on your abdomen should move out. Then purse your lips and exhale slowly. You should be able to feel the hand on your abdomen moving in as you exhale.  Learn and use controlled coughing to clear mucus from your lungs. Controlled coughing is a series of short, progressive coughs. The steps of controlled coughing are: 1. Lean your head slightly forward. 2. Breathe in deeply using diaphragmatic breathing. 3. Try to hold your breath for 3 seconds. 4. Keep your mouth slightly open while coughing twice. 5. Spit any mucus out into a tissue. 6. Rest and repeat the steps once or twice as needed. SEEK MEDICAL CARE IF:  You are coughing up more mucus than usual.  There is a change in the color or thickness of your mucus.  Your breathing is more labored than usual.  Your breathing is faster than usual. SEEK IMMEDIATE MEDICAL CARE IF:  You have shortness of breath while you are resting.  You have shortness of breath that prevents you from:  Being able to talk.  Performing your usual physical activities.  You have chest pain lasting longer than 5 minutes.  Your skin color is more cyanotic than usual.  You measure low oxygen saturations for longer than 5 minutes with a pulse oximeter. MAKE SURE YOU:  Understand these instructions.  Will watch your condition.  Will get help right away if you are not doing well or get worse.   This information is not intended to replace advice given to you by your health care provider. Make sure you discuss any questions you have with your health care provider.   Document Released: 08/18/2005 Document Revised: 11/29/2014 Document Reviewed: 07/05/2013 Elsevier Interactive Patient Education 2016 [employer]  

## 2016-01-09 ENCOUNTER — Telehealth: Payer: Self-pay | Admitting: *Deleted

## 2016-01-09 DIAGNOSIS — J439 Emphysema, unspecified: Secondary | ICD-10-CM

## 2016-01-09 NOTE — Telephone Encounter (Signed)
LM with Significant other that pt needs O2 at night. Order placed.

## 2016-01-15 ENCOUNTER — Telehealth: Payer: Self-pay | Admitting: Family

## 2016-01-15 ENCOUNTER — Ambulatory Visit: Payer: Medicare Other | Admitting: Family

## 2016-01-15 NOTE — Telephone Encounter (Signed)
Patient did not show for his initial appointment [facility] on 01/15/16. Will attempt to reschedule.

## 2016-02-20 ENCOUNTER — Ambulatory Visit: Payer: Medicare Other | Attending: Family | Admitting: Family

## 2016-02-20 ENCOUNTER — Encounter: Payer: Self-pay | Admitting: Family

## 2016-02-20 VITALS — BP 117/49 | HR 88 | Resp 20 | Ht 68.0 in | Wt 188.0 lb

## 2016-02-20 DIAGNOSIS — I252 Old myocardial infarction: Secondary | ICD-10-CM | POA: Diagnosis not present

## 2016-02-20 DIAGNOSIS — Z833 Family history of diabetes mellitus: Secondary | ICD-10-CM | POA: Diagnosis not present

## 2016-02-20 DIAGNOSIS — J449 Chronic obstructive pulmonary disease, unspecified: Secondary | ICD-10-CM | POA: Insufficient documentation

## 2016-02-20 DIAGNOSIS — Z9889 Other specified postprocedural states: Secondary | ICD-10-CM | POA: Insufficient documentation

## 2016-02-20 DIAGNOSIS — F17210 Nicotine dependence, cigarettes, uncomplicated: Secondary | ICD-10-CM | POA: Insufficient documentation

## 2016-02-20 DIAGNOSIS — I2510 Atherosclerotic heart disease of native coronary artery without angina pectoris: Secondary | ICD-10-CM | POA: Diagnosis not present

## 2016-02-20 DIAGNOSIS — I5032 Chronic diastolic (congestive) heart failure: Secondary | ICD-10-CM | POA: Diagnosis not present

## 2016-02-20 DIAGNOSIS — Z716 Tobacco abuse counseling: Secondary | ICD-10-CM | POA: Insufficient documentation

## 2016-02-20 DIAGNOSIS — R0602 Shortness of breath: Secondary | ICD-10-CM | POA: Diagnosis not present

## 2016-02-20 DIAGNOSIS — Z801 Family history of malignant neoplasm of trachea, bronchus and lung: Secondary | ICD-10-CM | POA: Insufficient documentation

## 2016-02-20 DIAGNOSIS — Z7951 Long term (current) use of inhaled steroids: Secondary | ICD-10-CM | POA: Insufficient documentation

## 2016-02-20 DIAGNOSIS — Z794 Long term (current) use of insulin: Secondary | ICD-10-CM | POA: Diagnosis not present

## 2016-02-20 DIAGNOSIS — R5383 Other fatigue: Secondary | ICD-10-CM | POA: Insufficient documentation

## 2016-02-20 DIAGNOSIS — I110 Hypertensive heart disease with heart failure: Secondary | ICD-10-CM | POA: Insufficient documentation

## 2016-02-20 DIAGNOSIS — Z7982 Long term (current) use of aspirin: Secondary | ICD-10-CM | POA: Insufficient documentation

## 2016-02-20 DIAGNOSIS — E119 Type 2 diabetes mellitus without complications: Secondary | ICD-10-CM | POA: Diagnosis not present

## 2016-02-20 DIAGNOSIS — I10 Essential (primary) hypertension: Secondary | ICD-10-CM

## 2016-02-20 DIAGNOSIS — Z720 Tobacco use: Secondary | ICD-10-CM

## 2016-02-20 NOTE — Progress Notes (Signed)
Subjective:    Patient ID: Mario Peterson, male    DOB: 1951-11-06, 65 y.o.   MRN: [id number]  Congestive Heart Failure Presents for initial visit. The disease course has been improving. Associated symptoms include fatigue and shortness of breath. Pertinent negatives include no abdominal pain, chest pain, edema, orthopnea or palpitations. The symptoms have been improving. Past treatments include ACE inhibitors, beta blockers and salt and fluid restriction. The treatment provided mild relief. Compliance with prior treatments has been good. His past medical history is significant for CAD, chronic lung disease, DM and HTN. There is no history of CVA.  Hypertension This is a chronic problem. The current episode started more than 1 year ago. The problem is unchanged. The problem is controlled. Associated symptoms include shortness of breath. Pertinent negatives include no blurred vision, chest pain, headaches, neck pain, palpitations or peripheral edema. There are no associated agents to hypertension. Risk factors for coronary artery disease include diabetes mellitus, male gender and smoking/tobacco exposure. Past treatments include beta blockers, ACE inhibitors, diuretics and lifestyle changes. The current treatment provides moderate improvement. Compliance problems include exercise.  Hypertensive end-organ damage includes CAD/MI and heart failure.   Past Medical History  Diagnosis Date  . Diabetes mellitus without complication ([place])   . Hypertension   . COPD (chronic obstructive pulmonary disease) ([place])   . MI (myocardial infarction) [facility])     Past Surgical History  Procedure Laterality Date  . Cardiac surgery    . Carotid endarterectomy    . Hernia repair      Family History  Problem Relation Age of Onset  . Diabetes Mother   . Lung cancer Father     Social History  Substance Use Topics  . Smoking status: Current Every Day Smoker -- 2.00 packs/day for 60 years    Types: Cigarettes   . Smokeless tobacco: Never Used  . Alcohol Use: No    No Known Allergies  Prior to Admission medications   Medication Sig Start Date End Date Taking? Authorizing Provider  ACCU-CHEK AVIVA PLUS test strip  11/17/15  Yes Historical Provider, MD  albuterol (PROVENTIL HFA;VENTOLIN HFA) 108 (90 Base) MCG/ACT inhaler Inhale 1-2 puffs into the lungs every 4 (four) hours as needed for wheezing or shortness of breath.    Yes Historical Provider, MD  aspirin EC 81 MG tablet Take 1 tablet (81 mg total) by mouth daily. 11/25/15  Yes Gladstone Lighter, MD  atorvastatin (LIPITOR) 80 MG tablet Take 80 mg by mouth at bedtime.   Yes Historical Provider, MD  Blood Glucose Monitoring Suppl (ACCU-CHEK AVIVA PLUS) w/Device KIT  11/18/15  Yes Historical Provider, MD  chlorpheniramine-HYDROcodone (TUSSIONEX PENNKINETIC ER) 10-8 MG/5ML SUER Take 5 mLs by mouth every 12 (twelve) hours as needed for cough. 11/23/15  Yes Theodoro Grist, MD  clopidogrel (PLAVIX) 75 MG tablet Take 75 mg by mouth daily.   Yes Historical Provider, MD  Fluticasone Furoate-Vilanterol 100-25 MCG/INH AEPB Inhale 1 puff into the lungs daily. 11/28/15  Yes Flora Lipps, MD  furosemide (LASIX) 40 MG tablet Take 40 mg by mouth daily.   Yes Historical Provider, MD  insulin glargine (LANTUS) 100 UNIT/ML injection Inject 0.25 mLs (25 Units total) into the skin at bedtime. 11/23/15  Yes Theodoro Grist, MD  isosorbide mononitrate (IMDUR) 30 MG 24 hr tablet Take 30 mg by mouth daily.   Yes Historical Provider, MD  lisinopril (PRINIVIL,ZESTRIL) 20 MG tablet Take 1 tablet (20 mg total) by mouth 2 (two) times daily. 11/25/15  Yes Gladstone Lighter, MD  metoprolol tartrate (LOPRESSOR) 25 MG tablet Take 25 mg by mouth 2 (two) times daily.   Yes Historical Provider, MD  mirtazapine (REMERON) 15 MG tablet Take 15 mg by mouth at bedtime. Reported on 02/20/2016   Yes Historical Provider, MD  nitroGLYCERIN (NITROSTAT) 0.4 MG SL tablet Place 0.4 mg under the tongue every 5  (five) minutes as needed for chest pain.   Yes Historical Provider, MD  OLANZapine (ZYPREXA) 10 MG tablet Take 10 mg by mouth at bedtime.   Yes Historical Provider, MD  omeprazole (PRILOSEC) 20 MG capsule Take 20 mg by mouth 2 (two) times daily before a meal.   Yes Historical Provider, MD  sitaGLIPtin (JANUVIA) 100 MG tablet Take 100 mg by mouth daily.   Yes Historical Provider, MD  tamsulosin (FLOMAX) 0.4 MG CAPS capsule Take 0.4 mg by mouth at bedtime.    Yes Historical Provider, MD      Review of Systems  Constitutional: Positive for appetite change and fatigue.  HENT: Negative for congestion, postnasal drip and sore throat.   Eyes: Negative.  Negative for blurred vision.  Respiratory: Positive for cough (loose nonproductive), chest tightness (at times) and shortness of breath. Negative for wheezing.   Cardiovascular: Negative for chest pain, palpitations and leg swelling.  Gastrointestinal: Negative for abdominal pain and abdominal distention.  Endocrine: Negative.   Genitourinary: Negative.   Musculoskeletal: Negative for back pain and neck pain.  Skin: Negative.   Allergic/Immunologic: Negative.   Neurological: Positive for numbness (& tingling due to neuropathy). Negative for dizziness, light-headedness and headaches.  Hematological: Negative for adenopathy. Does not bruise/bleed easily.  Psychiatric/Behavioral: Negative for sleep disturbance (sleeping on 1 pillow) and dysphoric mood. The patient is not nervous/anxious.        Objective:   Physical Exam  Constitutional: He is oriented to person, place, and time. He appears well-developed and well-nourished.  HENT:  Head: Normocephalic and atraumatic.  Eyes: Conjunctivae are normal. Pupils are equal, round, and reactive to light.  Neck: Normal range of motion. Neck supple.  Cardiovascular: Normal rate and regular rhythm.   Pulmonary/Chest: Effort normal. He has no wheezes. He has no rales.  Abdominal: Soft. He exhibits no  distension. There is no tenderness.  Musculoskeletal: He exhibits no edema or tenderness.  Neurological: He is alert and oriented to person, place, and time.  Skin: Skin is warm and dry.  Psychiatric: He has a normal mood and affect. His behavior is normal. Thought content normal.  Nursing note and vitals reviewed.   BP 117/49 mmHg  Pulse 88  Resp 20  Ht 5' 8"  (1.727 m)  Wt 188 lb (85.276 kg)  BMI 28.59 kg/m2  SpO2 86%       Assessment & Plan:  1: Chronic heart failure with preserved ejection fraction- Patient presents with fatigue upon exertion. He also has some shortness of breath upon exertion which does improve at rest. He came into the office in a wheelchair because he had neuropathy in his legs making it difficult to walk very much. He is getting weighed on a monthly basis at [facility] and an order was written for him to get weighed daily and to call for an overnight weight gain of >2 pounds or a weekly weight gain of >5 pounds. He does like to use salt and says that he "love salt". Discussed the importance of following a 2018m sodium diet and written information was given to him about this. Encouraged him to  use pepper or Mrs. Dash for seasoning. Has seen his cardiologist and returns to him June 2017. 2: HTN- Blood pressure looks good today. Continue medications at this time. 3: COPD- Room air pulse ox was 86% upon coming into the room. He continues to use his inhalers but he may need a pulmonologist referral. 4: Tobacco use- Patient continues to smoke about 2 ppd of cigarettes and has for many years. Complete cessation was discussed for 2 minutes with him.  Return here in 1 month or sooner for any questions/problems before then.

## 2016-02-20 NOTE — Patient Instructions (Signed)
Begin weighing daily and call for an overnight weight gain of > 2 pounds or a weekly weight gain of >5 pounds. 

## 2016-03-22 ENCOUNTER — Ambulatory Visit: Payer: Medicare Other | Admitting: Family

## 2017-11-27 IMAGING — CT CT ANGIO CHEST
1 of 2 series · 18 of 30 positions shown · IV contrast (APPLIED)
Comparison: None.

CLINICAL DATA: Initial encounter for several month history of cough
and now with hypoxia

EXAM:
CT ANGIOGRAPHY CHEST WITH CONTRAST
TECHNIQUE: Multidetector CT imaging of the chest was performed using the
standard protocol during bolus administration of intravenous
contrast. Multiplanar CT image reconstructions and MIPs were
obtained to evaluate the vascular anatomy.
CONTRAST:  100mL OMNIPAQUE IOHEXOL 350 MG/ML SOLN

[Series 5: pe 1.0 thins · axial · 0.70mm/px · z∈[-136,+104]mm · 18 of 271 slices shown]
[im 16/271  lung]
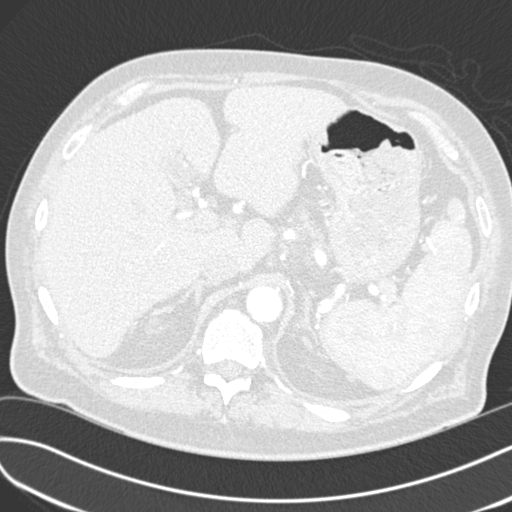
[im 31/271  mediastinal]
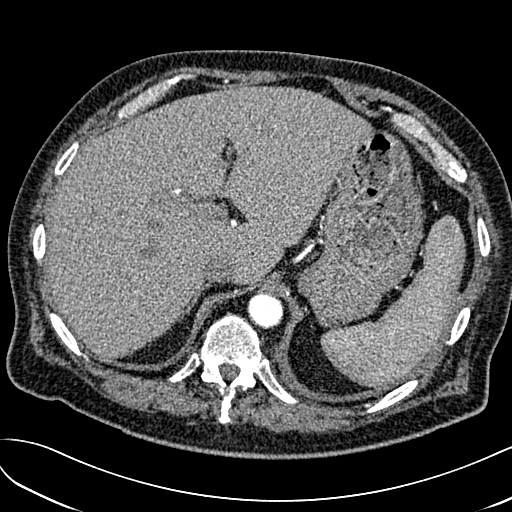
[im 46/271  lung]
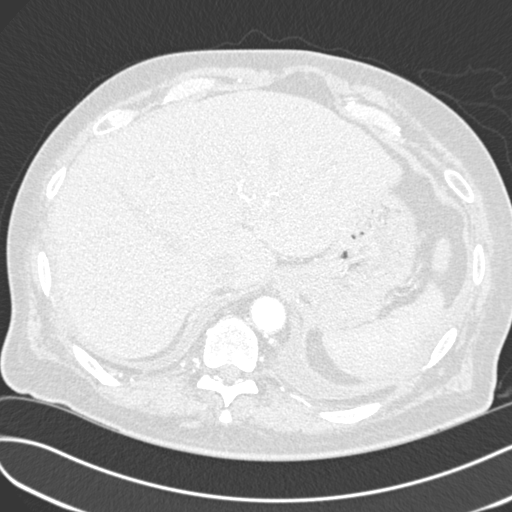
[im 61/271  mediastinal]
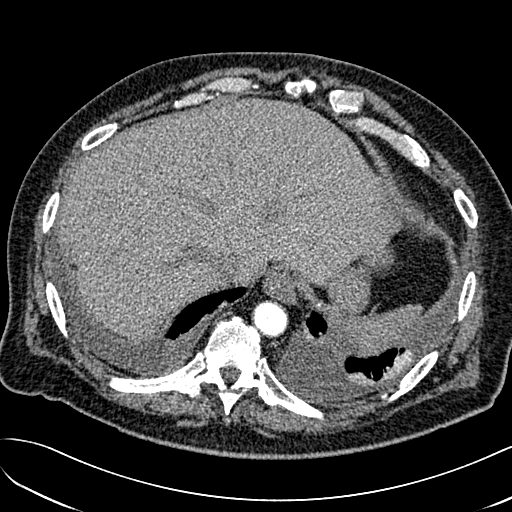
[im 76/271  lung]
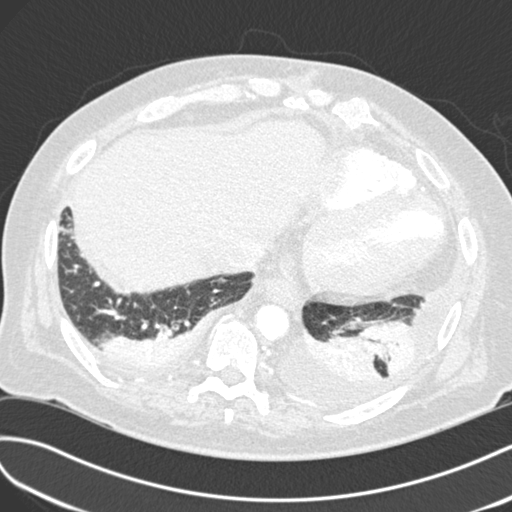
[im 91/271  mediastinal]
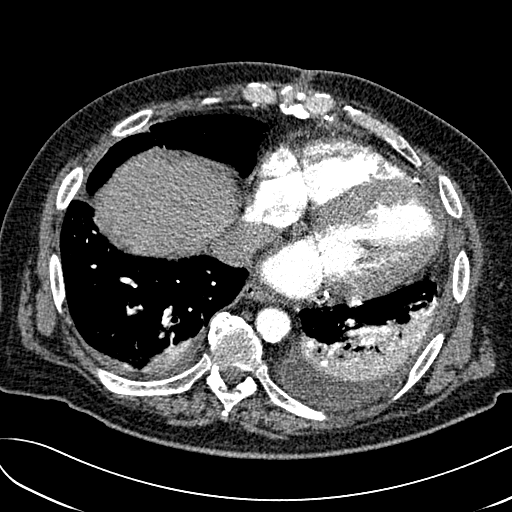
[im 106/271  lung]
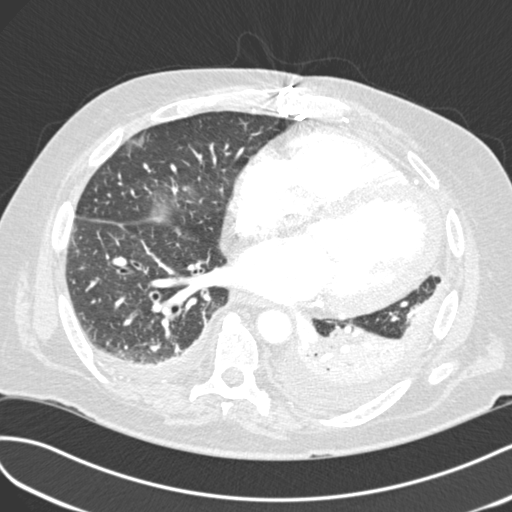
[im 121/271  mediastinal]
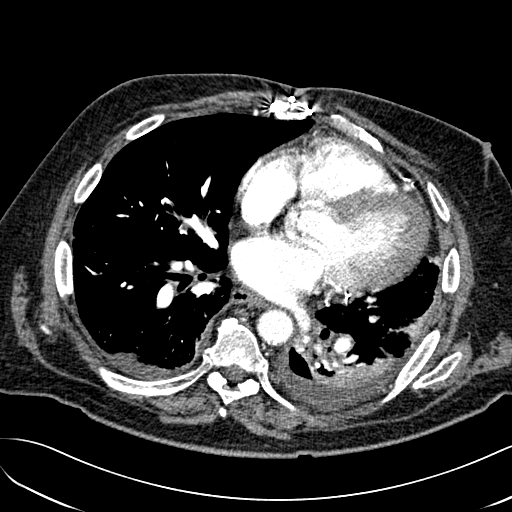
[im 126/271  lung]
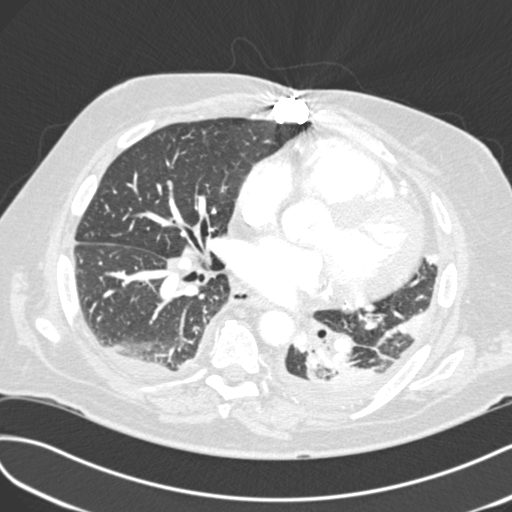
[im 136/271  mediastinal]
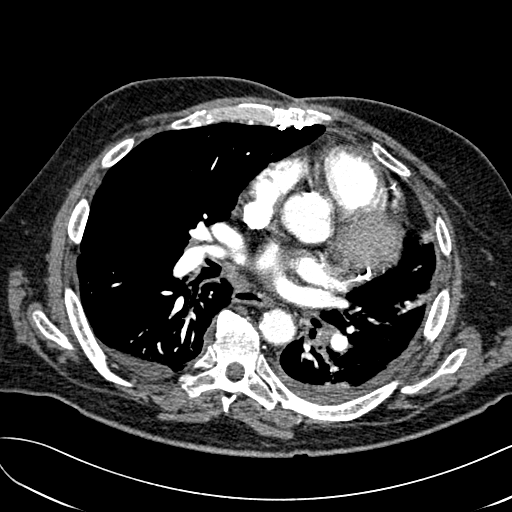
[im 151/271  lung]
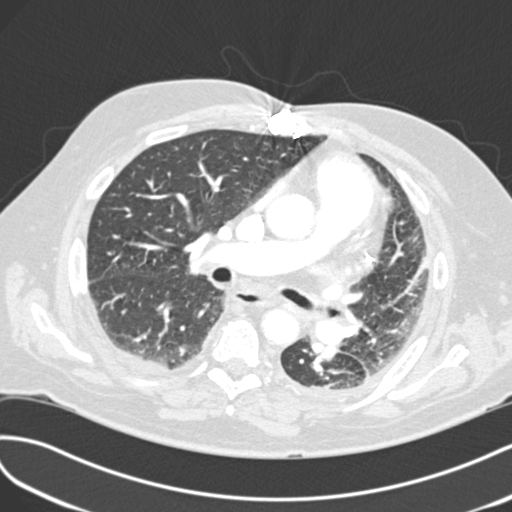
[im 166/271  mediastinal]
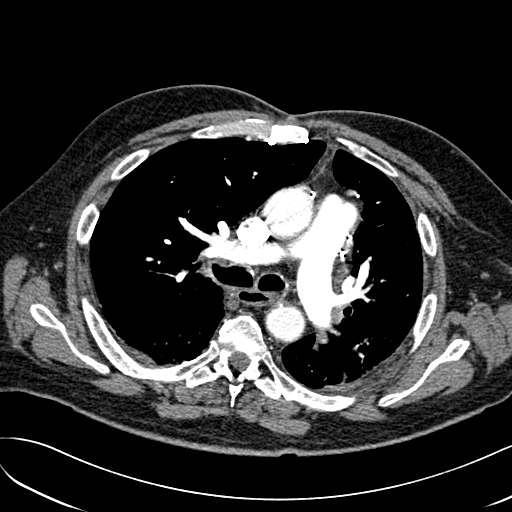
[im 181/271  lung]
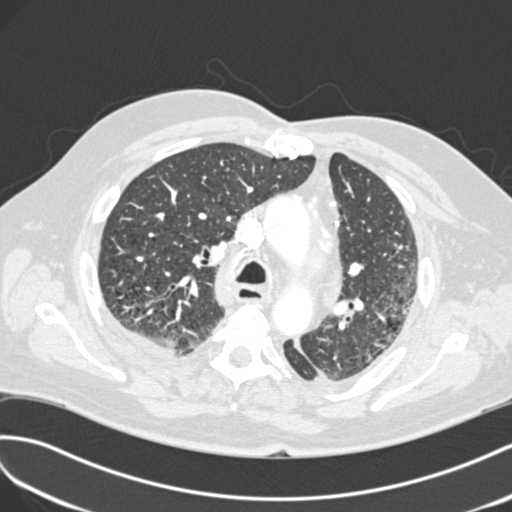
[im 196/271  mediastinal]
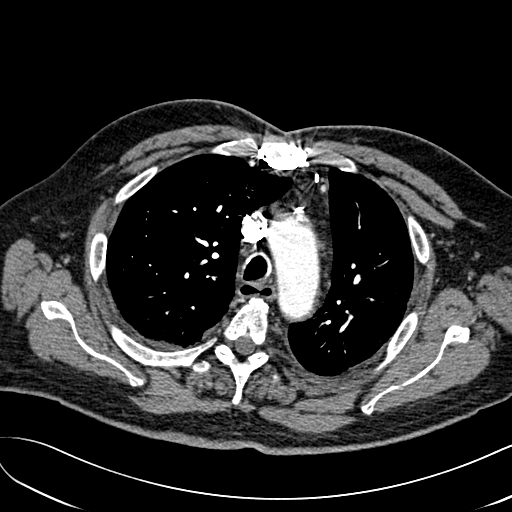
[im 211/271  lung]
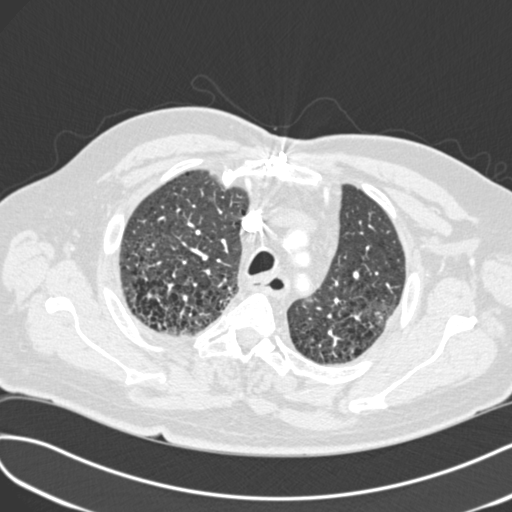
[im 226/271  mediastinal]
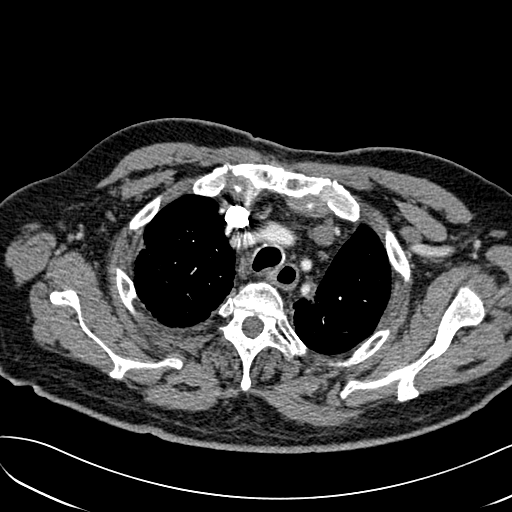
[im 241/271  lung]
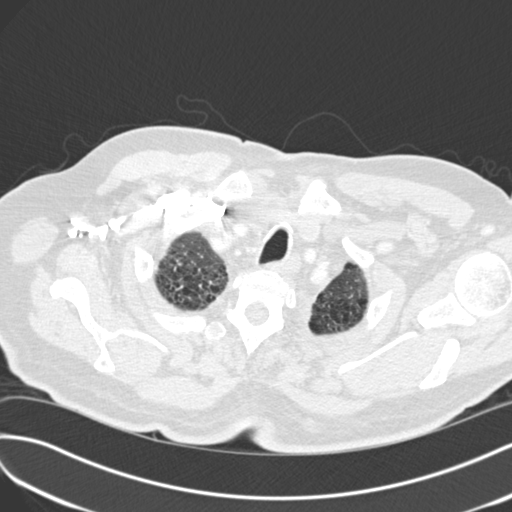
[im 256/271  mediastinal]
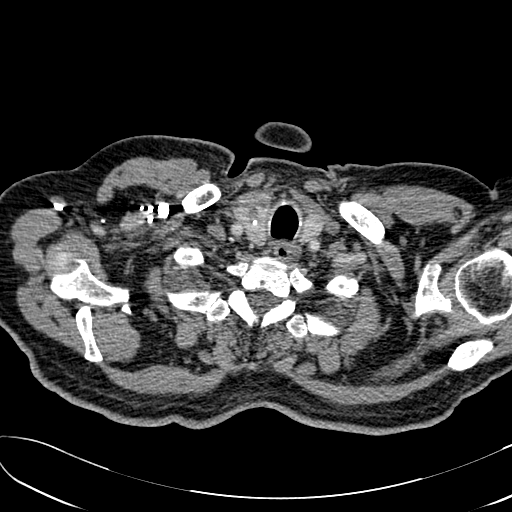

[18 of 30 positions shown; findings below may reference images not displayed]

FINDINGS: Mediastinum / Lymph Nodes: There is no axillary lymphadenopathy.
cm right thyroid nodule associated with other smaller nodules in the
right thyroid gland. Scattered small lymph nodes are seen in the
mediastinum. 14 mm short axis right hilar lymph node identified.
Heart size is enlarged. Patient is status post CABG. The esophagus
has normal imaging features.

No evidence for filling defect in the opacified pulmonary arteries
to suggest the presence of an acute pulmonary embolus. No evidence
for dissection flap in the thoracic aorta.

Lungs / Pleura: Changes of emphysema noted bilaterally. Bilateral
lower lobe collapse/ consolidation noted dependently, left greater
than right. Small left greater than right pleural effusions are
associated.

Upper Abdomen:  Unremarkable.

[HOSPITAL] / Soft Tissues: Bone windows reveal no worrisome lytic or
sclerotic osseous lesions.

Review of the MIP images confirms the above findings.
IMPRESSION: 1. No CT evidence for acute pulmonary embolus.
2. Bilateral lower lobe collapse/consolidation with small bilateral
pleural effusions
3. Emphysema.
